# Patient Record
Sex: Male | Born: 1985
Health system: Southern US, Community
[De-identification: ages and names within clinical notes are randomized; demographics above are authoritative.]

## PROBLEM LIST (undated history)

## (undated) DIAGNOSIS — F909 Attention-deficit hyperactivity disorder, unspecified type: Secondary | ICD-10-CM

## (undated) DIAGNOSIS — D649 Anemia, unspecified: Secondary | ICD-10-CM

## (undated) DIAGNOSIS — I639 Cerebral infarction, unspecified: Secondary | ICD-10-CM

## (undated) DIAGNOSIS — R202 Paresthesia of skin: Secondary | ICD-10-CM

## (undated) DIAGNOSIS — F329 Major depressive disorder, single episode, unspecified: Secondary | ICD-10-CM

## (undated) DIAGNOSIS — F32A Depression, unspecified: Secondary | ICD-10-CM

## (undated) DIAGNOSIS — G459 Transient cerebral ischemic attack, unspecified: Secondary | ICD-10-CM

## (undated) DIAGNOSIS — I10 Essential (primary) hypertension: Secondary | ICD-10-CM

## (undated) DIAGNOSIS — F419 Anxiety disorder, unspecified: Secondary | ICD-10-CM

## (undated) HISTORY — DX: Attention-deficit hyperactivity disorder, unspecified type: F90.9

## (undated) HISTORY — DX: Paresthesia of skin: R20.2

## (undated) HISTORY — DX: Major depressive disorder, single episode, unspecified: F32.9

## (undated) HISTORY — DX: Anxiety disorder, unspecified: F41.9

## (undated) HISTORY — PX: HERNIA REPAIR: SHX51

## (undated) HISTORY — DX: Anemia, unspecified: D64.9

## (undated) HISTORY — DX: Cerebral infarction, unspecified: I63.9

## (undated) HISTORY — DX: Depression, unspecified: F32.A

## (undated) HISTORY — DX: Essential (primary) hypertension: I10

---
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
## 2006-02-17 ENCOUNTER — Emergency Department (HOSPITAL_COMMUNITY): Admission: EM | Admit: 2006-02-17 | Discharge: 2006-02-18 | Payer: Self-pay | Admitting: Emergency Medicine

## 2006-11-15 ENCOUNTER — Emergency Department (HOSPITAL_COMMUNITY): Admission: EM | Admit: 2006-11-15 | Discharge: 2006-11-15 | Payer: Self-pay | Admitting: Emergency Medicine

## 2007-08-11 ENCOUNTER — Emergency Department (HOSPITAL_COMMUNITY): Admission: EM | Admit: 2007-08-11 | Discharge: 2007-08-11 | Payer: Self-pay | Admitting: Emergency Medicine

## 2010-06-25 ENCOUNTER — Emergency Department (HOSPITAL_COMMUNITY)
Admission: EM | Admit: 2010-06-25 | Discharge: 2010-06-25 | Payer: Self-pay | Source: Home / Self Care | Admitting: Emergency Medicine

## 2010-08-07 ENCOUNTER — Emergency Department (HOSPITAL_COMMUNITY)
Admission: EM | Admit: 2010-08-07 | Discharge: 2010-08-08 | Disposition: A | Discharge status: Home / Self Care | Payer: Self-pay | Attending: Emergency Medicine | Admitting: Emergency Medicine

## 2010-08-07 DIAGNOSIS — F17200 Nicotine dependence, unspecified, uncomplicated: Secondary | ICD-10-CM | POA: Insufficient documentation

## 2010-08-07 DIAGNOSIS — IMO0002 Reserved for concepts with insufficient information to code with codable children: Secondary | ICD-10-CM | POA: Insufficient documentation

## 2010-08-07 DIAGNOSIS — Y929 Unspecified place or not applicable: Secondary | ICD-10-CM | POA: Insufficient documentation

## 2010-08-07 DIAGNOSIS — H9209 Otalgia, unspecified ear: Secondary | ICD-10-CM | POA: Insufficient documentation

## 2010-08-07 DIAGNOSIS — T169XXA Foreign body in ear, unspecified ear, initial encounter: Secondary | ICD-10-CM | POA: Insufficient documentation

## 2011-02-22 LAB — URINALYSIS, ROUTINE W REFLEX MICROSCOPIC
Bilirubin Urine: NEGATIVE
Glucose, UA: NEGATIVE
Ketones, ur: NEGATIVE
pH: 6

## 2011-03-17 LAB — URINALYSIS, ROUTINE W REFLEX MICROSCOPIC
Hgb urine dipstick: NEGATIVE
Nitrite: NEGATIVE
Specific Gravity, Urine: 1.02
pH: 7.5

## 2011-03-17 LAB — URINE MICROSCOPIC-ADD ON

## 2011-03-17 LAB — URINE CULTURE: Colony Count: NO GROWTH

## 2011-06-01 DIAGNOSIS — I639 Cerebral infarction, unspecified: Secondary | ICD-10-CM

## 2011-06-01 HISTORY — DX: Cerebral infarction, unspecified: I63.9

## 2014-09-11 ENCOUNTER — Encounter (HOSPITAL_COMMUNITY): Payer: Self-pay | Admitting: *Deleted

## 2014-09-11 ENCOUNTER — Emergency Department (HOSPITAL_COMMUNITY): Payer: Self-pay

## 2014-09-11 ENCOUNTER — Emergency Department (HOSPITAL_COMMUNITY)
Admission: EM | Admit: 2014-09-11 | Discharge: 2014-09-11 | Disposition: A | Discharge status: Home / Self Care | Payer: Self-pay | Attending: Emergency Medicine | Admitting: Emergency Medicine

## 2014-09-11 DIAGNOSIS — Z720 Tobacco use: Secondary | ICD-10-CM | POA: Insufficient documentation

## 2014-09-11 DIAGNOSIS — Z8673 Personal history of transient ischemic attack (TIA), and cerebral infarction without residual deficits: Secondary | ICD-10-CM | POA: Insufficient documentation

## 2014-09-11 DIAGNOSIS — F1210 Cannabis abuse, uncomplicated: Secondary | ICD-10-CM | POA: Insufficient documentation

## 2014-09-11 DIAGNOSIS — R51 Headache: Secondary | ICD-10-CM | POA: Insufficient documentation

## 2014-09-11 DIAGNOSIS — R202 Paresthesia of skin: Secondary | ICD-10-CM | POA: Insufficient documentation

## 2014-09-11 HISTORY — DX: Transient cerebral ischemic attack, unspecified: G45.9

## 2014-09-11 LAB — CBC WITH DIFFERENTIAL/PLATELET
BASOS ABS: 0 10*3/uL (ref 0.0–0.1)
BASOS PCT: 0 % (ref 0–1)
EOS PCT: 1 % (ref 0–5)
Eosinophils Absolute: 0 10*3/uL (ref 0.0–0.7)
HEMATOCRIT: 41.7 % (ref 39.0–52.0)
Hemoglobin: 13.7 g/dL (ref 13.0–17.0)
LYMPHS ABS: 2.3 10*3/uL (ref 0.7–4.0)
LYMPHS PCT: 32 % (ref 12–46)
MCH: 29.6 pg (ref 26.0–34.0)
MCHC: 32.9 g/dL (ref 30.0–36.0)
MCV: 90.1 fL (ref 78.0–100.0)
MONO ABS: 0.7 10*3/uL (ref 0.1–1.0)
MONOS PCT: 9 % (ref 3–12)
Neutro Abs: 4.1 10*3/uL (ref 1.7–7.7)
Neutrophils Relative %: 58 % (ref 43–77)
Platelets: 223 10*3/uL (ref 150–400)
RBC: 4.63 MIL/uL (ref 4.22–5.81)
RDW: 12.7 % (ref 11.5–15.5)
WBC: 7.1 10*3/uL (ref 4.0–10.5)

## 2014-09-11 LAB — BASIC METABOLIC PANEL
ANION GAP: 9 (ref 5–15)
BUN: 11 mg/dL (ref 6–23)
CALCIUM: 9 mg/dL (ref 8.4–10.5)
CO2: 24 mmol/L (ref 19–32)
Chloride: 104 mmol/L (ref 96–112)
Creatinine, Ser: 0.84 mg/dL (ref 0.50–1.35)
Glucose, Bld: 102 mg/dL — ABNORMAL HIGH (ref 70–99)
Potassium: 3.7 mmol/L (ref 3.5–5.1)
SODIUM: 137 mmol/L (ref 135–145)

## 2014-09-11 LAB — RAPID URINE DRUG SCREEN, HOSP PERFORMED
AMPHETAMINES: NOT DETECTED
BENZODIAZEPINES: NOT DETECTED
Barbiturates: NOT DETECTED
Cocaine: NOT DETECTED
Opiates: NOT DETECTED
TETRAHYDROCANNABINOL: POSITIVE — AB

## 2014-09-11 LAB — TSH: TSH: 3.516 u[IU]/mL (ref 0.350–4.500)

## 2014-09-11 MED ORDER — ASPIRIN 325 MG PO TABS
325.0000 mg | ORAL_TABLET | Freq: Once | ORAL | Status: AC
  Administered 2014-09-11: 325 mg via ORAL
  Filled 2014-09-11: qty 1

## 2014-09-11 NOTE — ED Provider Notes (Signed)
CSN: [id number]     Arrival date & time 09/11/14  1900 History   First MD Initiated Contact with Patient 09/11/14 1949     Chief Complaint  Patient presents with  . Numbness     (Consider location/radiation/quality/duration/timing/severity/associated sxs/prior Treatment) HPI Complains of intermittent numbness of tongue and around lips and in her left hand onset 4:30 PM today. Patient has had several episodes lasting from 3-10 minutes. No visual changes no difficulty speaking accompanied symptoms include mild frontal headache. No treatment prior to coming here. No difficulty with gait. No other associated symptoms. He is presently asymptomatic without treatment. Past Medical History  Diagnosis Date  . TIA (transient ischemic attack)    Past Surgical History  Procedure Laterality Date  . Hernia repair     History reviewed. No pertinent family history. History  Substance Use Topics  . Smoking status: Current Every Day Smoker  . Smokeless tobacco: Not on file  . Alcohol Use: No    Review of Systems  Neurological: Positive for numbness and headaches.  All other systems reviewed and are negative.     Allergies  Review of patient's allergies indicates no known allergies.  Home Medications   Prior to Admission medications   Medication Sig Start Date End Date Taking? Authorizing Provider  Acetaminophen (PAIN RELIEVER NO ASA EX ST PO) Take 1-2 tablets by mouth once as needed (FOR PAIN).   Yes Historical Provider, MD   BP 136/86 mmHg  Pulse 68  Temp(Src) 97.9 F (36.6 C) (Oral)  Resp 20  Ht  (1.803 m)  Wt 190 lb (86.183 kg)  BMI 26.51 kg/m2  SpO2 100% Physical Exam  Constitutional: He is oriented to person, place, and time. He appears well-developed and well-nourished.  HENT:  Head: Normocephalic and atraumatic.  Eyes: Conjunctivae are normal. Pupils are equal, round, and reactive to light.  Neck: Neck supple. No tracheal deviation present. No thyromegaly present.   Cardiovascular: Normal rate and regular rhythm.   No murmur heard. Pulmonary/Chest: Effort normal and breath sounds normal.  Abdominal: Soft. Bowel sounds are normal. He exhibits no distension. There is no tenderness.  Musculoskeletal: Normal range of motion. He exhibits no edema or tenderness.  Neurological: He is alert and oriented to person, place, and time. He has normal reflexes. Coordination normal.  DTRs symmetric bilaterally at knee jerk ankle jerk and biceps historical and bilaterally gait normal Romberg normal pronator drift normal  Skin: Skin is warm and dry. No rash noted.  Psychiatric: He has a normal mood and affect.  Nursing note and vitals reviewed.   ED Course  Procedures (including critical care time) Labs Review Labs Reviewed - No data to display  Imaging Review No results found.   EKG Interpretation   Date/Time:  Wednesday September 11 2014 21:48:20 EDT Ventricular Rate:  74 PR Interval:  165 QRS Duration: 97 QT Interval:  395 QTC Calculation: 438 R Axis:   79 Text Interpretation:  Sinus rhythm No old tracing to compare Confirmed by  Ethelda Chick  MD, Delvecchio Madole [id number]) on 09/11/2014 10:09:09 PM     Results for orders placed or performed during the hospital encounter of 09/11/14  Urine Drug Screen  Result Value Ref Range   Opiates NONE DETECTED NONE DETECTED   Cocaine NONE DETECTED NONE DETECTED   Benzodiazepines NONE DETECTED NONE DETECTED   Amphetamines NONE DETECTED NONE DETECTED   Tetrahydrocannabinol POSITIVE (A) NONE DETECTED   Barbiturates NONE DETECTED NONE DETECTED  CBC with Differential/Platelet  Result Value  Ref Range   WBC 7.1 4.0 - 10.5 K/uL   RBC 4.63 4.22 - 5.81 MIL/uL   Hemoglobin 13.7 13.0 - 17.0 g/dL   HCT 16.141.7 09.639.0 - 04.552.0 %   MCV 90.1 78.0 - 100.0 fL   MCH 29.6 26.0 - 34.0 pg   MCHC 32.9 30.0 - 36.0 g/dL   RDW 40.912.7 81.111.5 - 91.415.5 %   Platelets 223 150 - 400 K/uL   Neutrophils Relative % 58 43 - 77 %   Neutro Abs 4.1 1.7 - 7.7 K/uL    Lymphocytes Relative 32 12 - 46 %   Lymphs Abs 2.3 0.7 - 4.0 K/uL   Monocytes Relative 9 3 - 12 %   Monocytes Absolute 0.7 0.1 - 1.0 K/uL   Eosinophils Relative 1 0 - 5 %   Eosinophils Absolute 0.0 0.0 - 0.7 K/uL   Basophils Relative 0 0 - 1 %   Basophils Absolute 0.0 0.0 - 0.1 K/uL  Basic metabolic panel  Result Value Ref Range   Sodium 137 135 - 145 mmol/L   Potassium 3.7 3.5 - 5.1 mmol/L   Chloride 104 96 - 112 mmol/L   CO2 24 19 - 32 mmol/L   Glucose, Bld 102 (H) 70 - 99 mg/dL   BUN 11 6 - 23 mg/dL   Creatinine, Ser 7.820.84 0.50 - 1.35 mg/dL   Calcium 9.0 8.4 - 95.610.5 mg/dL   GFR calc non Af Amer >90 >90 mL/min   GFR calc Af Amer >90 >90 mL/min   Anion gap 9 5 - 15   Ct Head Wo Contrast  09/11/2014   CLINICAL DATA:  Left arm and tongue numbness  EXAM: CT HEAD WITHOUT CONTRAST  TECHNIQUE: Contiguous axial images were obtained from the base of the skull through the vertex without intravenous contrast.  COMPARISON:  Brain MRI September 11, 2014  FINDINGS: The ventricles are normal in size and configuration. There is no mass, hemorrhage, extra-axial fluid collection, or midline shift. Gray-white compartments are normal. No acute infarct apparent. Bony calvarium appears intact. The mastoid air cells are clear.  IMPRESSION: Study within normal limits.   Electronically Signed   By: Bretta BangWilliam  Woodruff III M.D.   On: 09/11/2014 22:00    MDM  [facility] with neurolog[facility] evaluation with echocardiogram carotid Dopplers. He feels that can be done as an inpatient or outpatient . Neurology also suggests aspirin. After lengthy discussion with patient he says he has a primary care physician who can arrange for outpatient studies Consultation for 5 minutes on smoking cessation Diagnosis#1 paresthesias Differential diagnosis includes TIA, migraine, atypical seizure #2 substance abuse #3 tobacco abuse Final diagnoses:  None        Doug SouSam Iker Nuttall,  MD 09/11/14 2243

## 2014-09-11 NOTE — Discharge Instructions (Signed)
Take aspirin 325 mg daily. Contact your primary care physician tomorrow. You need further outpatient testing which he can arrange for you. The tests needed are echocardiogram and carotid Doppler studies. Ask your primary care physician to help you to stop smoking as smoking is a risk factor for stroke and TIA. Return if you feel worse for any reason.

## 2014-09-11 NOTE — ED Notes (Signed)
Notified SOC @ 2025

## 2014-09-11 NOTE — ED Notes (Signed)
MD at bedside. 

## 2014-09-11 NOTE — ED Notes (Signed)
Patient transported to MRI 

## 2014-09-11 NOTE — ED Notes (Signed)
TTS at bedside completing assessment.

## 2014-09-11 NOTE — ED Notes (Signed)
Numb sensatin lt arm and tongue  Took an energy pill today.  Alert.  Hx of tia

## 2014-09-11 NOTE — ED Notes (Signed)
EDP at bedside  

## 2014-09-11 NOTE — ED Notes (Signed)
Onset 1930  Numbness/tingling in left hand   And around mouth

## 2014-09-12 LAB — VITAMIN B12: VITAMIN B 12: 314 pg/mL (ref 211–911)

## 2014-09-13 LAB — RPR: RPR Ser Ql: NONREACTIVE

## 2014-09-13 LAB — FOLATE RBC
Folate, Hemolysate: 450.2 ng/mL
Folate, RBC: 1085 ng/mL (ref >498)
Hematocrit: 41.5 % (ref 37.5–51.0)

## 2014-09-13 LAB — HIV ANTIBODY (ROUTINE TESTING W REFLEX): HIV SCREEN 4TH GENERATION: NONREACTIVE

## 2015-06-24 ENCOUNTER — Encounter: Payer: Self-pay | Admitting: Family Medicine

## 2015-06-24 ENCOUNTER — Ambulatory Visit (INDEPENDENT_AMBULATORY_CARE_PROVIDER_SITE_OTHER): Payer: BLUE CROSS/BLUE SHIELD | Admitting: Family Medicine

## 2015-06-24 VITALS — BP 140/78 | HR 80 | Temp 98.0°F | Resp 16 | Ht 69.5 in | Wt 205.0 lb

## 2015-06-24 DIAGNOSIS — K047 Periapical abscess without sinus: Secondary | ICD-10-CM

## 2015-06-24 DIAGNOSIS — F319 Bipolar disorder, unspecified: Secondary | ICD-10-CM

## 2015-06-24 DIAGNOSIS — Z8673 Personal history of transient ischemic attack (TIA), and cerebral infarction without residual deficits: Secondary | ICD-10-CM

## 2015-06-24 DIAGNOSIS — Z720 Tobacco use: Secondary | ICD-10-CM

## 2015-06-24 DIAGNOSIS — L723 Sebaceous cyst: Secondary | ICD-10-CM

## 2015-06-24 MED ORDER — AMOXICILLIN 500 MG PO CAPS: 500.0000 mg | ORAL_CAPSULE | Freq: Three times a day (TID) | ORAL | Status: DC

## 2015-06-24 MED ORDER — ACETAMINOPHEN-CODEINE #3 300-30 MG PO TABS: 1.0000 | ORAL_TABLET | Freq: Four times a day (QID) | ORAL | Status: DC | PRN
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
## 2015-06-24 NOTE — Patient Instructions (Addendum)
Release of records- [facility] in [place] We will call with lab results Echo and Ultrasound to be done We will check with Dr. Kristin Bruins for dental surgery  F/U pending results

## 2015-06-25 ENCOUNTER — Encounter: Payer: Self-pay | Admitting: Family Medicine

## 2015-06-25 LAB — LIPID PANEL
CHOL/HDL RATIO: 4.8 ratio (ref <=5.0)
Cholesterol: 158 mg/dL (ref 125–200)
HDL: 33 mg/dL — ABNORMAL LOW (ref >=40)
LDL CALC: 82 mg/dL (ref <130)
Triglycerides: 217 mg/dL — ABNORMAL HIGH (ref <150)
VLDL: 43 mg/dL — ABNORMAL HIGH (ref <30)

## 2015-06-25 LAB — CBC WITH DIFFERENTIAL/PLATELET
BASOS ABS: 0 10*3/uL (ref 0.0–0.1)
BASOS PCT: 0 % (ref 0–1)
EOS ABS: 0.1 10*3/uL (ref 0.0–0.7)
Eosinophils Relative: 1 % (ref 0–5)
HCT: 44 % (ref 39.0–52.0)
Hemoglobin: 14.6 g/dL (ref 13.0–17.0)
Lymphocytes Relative: 42 % (ref 12–46)
Lymphs Abs: 3 10*3/uL (ref 0.7–4.0)
MCH: 28.9 pg (ref 26.0–34.0)
MCHC: 33.2 g/dL (ref 30.0–36.0)
MCV: 87 fL (ref 78.0–100.0)
MPV: 10.9 fL (ref 8.6–12.4)
Monocytes Absolute: 0.6 10*3/uL (ref 0.1–1.0)
Monocytes Relative: 8 % (ref 3–12)
NEUTROS PCT: 49 % (ref 43–77)
Neutro Abs: 3.5 10*3/uL (ref 1.7–7.7)
PLATELETS: 301 10*3/uL (ref 150–400)
RBC: 5.06 MIL/uL (ref 4.22–5.81)
RDW: 13.2 % (ref 11.5–15.5)
WBC: 7.2 10*3/uL (ref 4.0–10.5)

## 2015-06-25 LAB — COMPREHENSIVE METABOLIC PANEL
ALK PHOS: 84 U/L (ref 40–115)
ALT: 23 U/L (ref 9–46)
AST: 16 U/L (ref 10–40)
Albumin: 4.3 g/dL (ref 3.6–5.1)
BUN: 9 mg/dL (ref 7–25)
CHLORIDE: 104 mmol/L (ref 98–110)
CO2: 27 mmol/L (ref 20–31)
CREATININE: 0.75 mg/dL (ref 0.60–1.35)
Calcium: 9.5 mg/dL (ref 8.6–10.3)
GLUCOSE: 82 mg/dL (ref 70–99)
Potassium: 4.3 mmol/L (ref 3.5–5.3)
SODIUM: 140 mmol/L (ref 135–146)
TOTAL PROTEIN: 6.8 g/dL (ref 6.1–8.1)
Total Bilirubin: 0.4 mg/dL (ref 0.2–1.2)

## 2015-06-25 LAB — TSH: TSH: 1.844 u[IU]/mL (ref 0.350–4.500)

## 2015-06-26 ENCOUNTER — Encounter: Payer: Self-pay | Admitting: *Deleted

## 2015-06-27 ENCOUNTER — Encounter: Payer: Self-pay | Admitting: Family Medicine

## 2015-06-27 DIAGNOSIS — I639 Cerebral infarction, unspecified: Secondary | ICD-10-CM | POA: Insufficient documentation

## 2015-06-27 DIAGNOSIS — G459 Transient cerebral ischemic attack, unspecified: Secondary | ICD-10-CM | POA: Insufficient documentation

## 2015-06-27 DIAGNOSIS — F909 Attention-deficit hyperactivity disorder, unspecified type: Secondary | ICD-10-CM | POA: Insufficient documentation

## 2015-06-27 DIAGNOSIS — F419 Anxiety disorder, unspecified: Secondary | ICD-10-CM | POA: Insufficient documentation

## 2015-06-27 DIAGNOSIS — I10 Essential (primary) hypertension: Secondary | ICD-10-CM | POA: Insufficient documentation

## 2015-06-30 ENCOUNTER — Ambulatory Visit (HOSPITAL_COMMUNITY): Payer: BLUE CROSS/BLUE SHIELD

## 2015-07-01 ENCOUNTER — Ambulatory Visit: Payer: Self-pay | Admitting: Family Medicine

## 2015-07-01 ENCOUNTER — Ambulatory Visit (HOSPITAL_COMMUNITY): Payer: BLUE CROSS/BLUE SHIELD | Attending: Family Medicine

## 2015-07-23 ENCOUNTER — Telehealth: Payer: Self-pay | Admitting: Family Medicine

## 2015-07-23 NOTE — Telephone Encounter (Signed)
-----   Message from Samuella Cota, [place] sent at 07/09/2015  8:57 AM EST ----- Regarding: RE: F/U Testing Dr. Kristin Bruins office called back spoke to [facility] and she stated that Dr. Kristin Bruins is not an oral surgeon so can not help this pt. I can call around and see who will be able to help this pt.   As far as the ECHO and Carotids looks like the pt was a NO SHOW, I spoke to wife when I scheduled and she was aware of the appt. I do not know reason why they cancelled it. ----- Message -----    From: Salley Scarlet, MD    Sent: 07/08/2015   1:22 PM      To: Samuella Cota, CMA Subject: F/U Testing                                      Pt was to have Echo and carotids done, I see they were cancelled  Also did he get any information about Dentist with [facility] Dr. Kristin Bruins, he needs to have teeth removed, see if they would take his insurance

## 2015-07-25 ENCOUNTER — Telehealth: Payer: Self-pay | Admitting: *Deleted

## 2015-07-25 NOTE — Telephone Encounter (Signed)
-----   Message from Salley Scarlet, MD sent at 07/20/2015  8:17 PM EST ----- Regarding: FW: F/U Testing Please call pt and ask to reschedule, document in chart  ----- Message -----    From: Samuella Cota, CMA    Sent: 07/09/2015   8:57 AM      To: Salley Scarlet, MD Subject: RE: F/U Testing                                Dr. Kristin Bruins office called back spoke to [facility] and she stated that Dr. Kristin Bruins is not an oral surgeon so can not help this pt. I can call around and see who will be able to help this pt.   As far as the ECHO and Carotids looks like the pt was a NO SHOW, I spoke to wife when I scheduled and she was aware of the appt. I do not know reason why they cancelled it. ----- Message -----    From: Salley Scarlet, MD    Sent: 07/08/2015   1:22 PM      To: Samuella Cota, CMA Subject: F/U Testing                                      Pt was to have Echo and carotids done, I see they were cancelled  Also did he get any information about Dentist with [facility] Dr. Kristin Bruins, he needs to have teeth removed, see if they would take his insurance

## 2015-07-25 NOTE — Telephone Encounter (Signed)
LMTRC on mobile number

## 2015-07-31 NOTE — Telephone Encounter (Signed)
Pt wife called back and I gave her some information on oral surgeons and informed her that most oral surgeons are recommending the Care Credit, which helps pay for out-of-pocket healthcare expenses. Once approved, can use it again and again* to help manage health, wellness, and personal care for cost that is not covered by insurance. Wife states that she will try to go on line and apply.  Also informed pt that most dental surgeons have a consultation fee of 95-150 dollars which includes xrays, and extraction fees are 179-625 depending on difficulty. Pt states will discuss with husband and will get back with me.   Pt wife also stated that she will call and reschedule for the ECHO and Carotids and reason did not keep appt was waiting on new insurance to kick in which is now effective Mar 1, 17

## 2015-08-01 NOTE — Telephone Encounter (Signed)
noted 

## 2015-08-11 ENCOUNTER — Other Ambulatory Visit: Payer: Self-pay | Admitting: Family Medicine

## 2015-08-11 DIAGNOSIS — Z8673 Personal history of transient ischemic attack (TIA), and cerebral infarction without residual deficits: Secondary | ICD-10-CM

## 2015-08-11 DIAGNOSIS — R2243 Localized swelling, mass and lump, lower limb, bilateral: Secondary | ICD-10-CM

## 2015-08-15 ENCOUNTER — Other Ambulatory Visit (HOSPITAL_COMMUNITY): Payer: BLUE CROSS/BLUE SHIELD

## 2015-08-15 ENCOUNTER — Ambulatory Visit (HOSPITAL_COMMUNITY): Payer: BLUE CROSS/BLUE SHIELD

## 2015-08-22 ENCOUNTER — Ambulatory Visit (HOSPITAL_COMMUNITY): Payer: BLUE CROSS/BLUE SHIELD | Attending: Family Medicine

## 2015-08-22 ENCOUNTER — Ambulatory Visit (HOSPITAL_COMMUNITY): Admission: RE | Admit: 2015-08-22 | Payer: BLUE CROSS/BLUE SHIELD | Source: Ambulatory Visit

## 2015-09-02 ENCOUNTER — Other Ambulatory Visit: Payer: Self-pay | Admitting: Family Medicine

## 2015-09-02 DIAGNOSIS — R2243 Localized swelling, mass and lump, lower limb, bilateral: Secondary | ICD-10-CM

## 2015-09-02 DIAGNOSIS — Z8673 Personal history of transient ischemic attack (TIA), and cerebral infarction without residual deficits: Secondary | ICD-10-CM

## 2015-09-05 ENCOUNTER — Other Ambulatory Visit: Payer: BLUE CROSS/BLUE SHIELD

## 2015-09-08 ENCOUNTER — Other Ambulatory Visit: Payer: BLUE CROSS/BLUE SHIELD

## 2015-09-15 ENCOUNTER — Inpatient Hospital Stay: Admission: RE | Admit: 2015-09-15 | Payer: BLUE CROSS/BLUE SHIELD | Source: Ambulatory Visit

## 2015-09-22 ENCOUNTER — Other Ambulatory Visit: Payer: Self-pay | Admitting: Family Medicine

## 2015-09-24 ENCOUNTER — Other Ambulatory Visit: Payer: Self-pay | Admitting: Family Medicine

## 2015-09-26 ENCOUNTER — Other Ambulatory Visit: Payer: BLUE CROSS/BLUE SHIELD

## 2015-09-26 ENCOUNTER — Ambulatory Visit: Payer: BLUE CROSS/BLUE SHIELD | Admitting: Family Medicine

## 2015-09-29 ENCOUNTER — Encounter: Payer: Self-pay | Admitting: Family Medicine

## 2015-09-29 ENCOUNTER — Ambulatory Visit (INDEPENDENT_AMBULATORY_CARE_PROVIDER_SITE_OTHER): Payer: BLUE CROSS/BLUE SHIELD | Admitting: Family Medicine

## 2015-09-29 VITALS — BP 128/70 | HR 82 | Temp 98.0°F | Resp 16 | Ht 69.5 in | Wt 202.0 lb

## 2015-09-29 DIAGNOSIS — K047 Periapical abscess without sinus: Secondary | ICD-10-CM

## 2015-09-29 MED ORDER — AMOXICILLIN 500 MG PO CAPS: 500.0000 mg | ORAL_CAPSULE | Freq: Three times a day (TID) | ORAL | Status: DC

## 2015-09-29 MED ORDER — ACETAMINOPHEN-CODEINE #3 300-30 MG PO TABS: 1.0000 | ORAL_TABLET | Freq: Four times a day (QID) | ORAL | Status: DC | PRN

## 2015-09-29 NOTE — Patient Instructions (Signed)
F/U Pending results  Take antibiotics

## 2015-09-29 NOTE — Progress Notes (Signed)
Patient ID: Danny Mathis, male   DOB: 03/23/1986, 30 y.o.   MRN: [id number]     Subjective:    Patient ID: Danny SalvageJames L Mathis, male    DOB: 09/18/1985, 30 y.o.   MRN: [id number]  Patient presents for Tooth Pain Patient here with persistent tooth pain. He is still not seen a dentist. We tried calling around oral surgeons on his behalf with his insurance he was then given informationHe states because of his insurance change they will not do anything surgical with his teeth for another year. He gets basic dental insurance after the first 6 months. When he did call around he was told that he would have to pay per tooth and he does not have the finances to do this. I gave him amoxicillin for 10 days and Tylenol 3 back in January.  With regards to his mini stroke like episodes and chest discomfort he has not had any since he quit drinking. 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 I had him set up to have carotid ultrasounds on an echocardiogram because of the multiple episodes I also have hospital visits where they were concerned and also requested this workup to be done. Due to finances he had to put this off. He is now scheduled to have the ultrasound done on his carotids next week and he is set to see a cardiologist on May 19   Review Of Systems:  GEN- denies fatigue, fever, weight loss,weakness, recent illness HEENT- denies eye drainage, change in vision, nasal discharge, CVS- denies chest pain, palpitations RESP- denies SOB, cough, wheeze ABD- denies N/V, change in stools, abd pain GU- denies dysuria, hematuria, dribbling, incontinence MSK- denies joint pain, muscle aches, injury Neuro- denies headache, dizziness, syncope, seizure activity       Objective:    BP 128/70 mmHg  Pulse 82  Temp(Src) 98 F (36.7 C) (Oral)  Resp 16  Ht 5' 9.5" (1.765 m)  Wt 202 lb (91.627 kg)  BMI 29.41 kg/m2 GEN- NAD, alert and  oriented x3 HEENT- PERRL, EOMI, non injected sclera, pink conjunctiva, MMM, oropharynx poor dentitiom, swelling of gums, broken teeth,TTP entire gumline upper and lower  Neck- Supple, shotty ant LAD  CVS- RRR, no murmur RESP-CTAB Pulses- Radial 2+        Assessment & Plan:      Problem List Items Addressed This Visit    None    Visit Diagnoses    Dental abscess    -  Primary    Recurrent dental abscess, he is using peroxide rinse, give another round of amox and tylenol #3, discussed I can not continue to treat his dental issues. Also discussed that he really needs to follow through with his cardiac and ultrasound workup he is actually been to a few different hospitals with these TIA-like episodes witnessed. He is very young but I think that he needs imaging. If we do find that there is some type of valve problem on his echocardiogram this may help us expedite his dental issues using his medical insurance.        Note: This dictation was prepared with Dragon dictation along with smaller phrase technology. Any transcriptional errors that result from this process are unintentional.

## 2015-10-01 ENCOUNTER — Ambulatory Visit: Payer: BLUE CROSS/BLUE SHIELD | Admitting: Cardiology

## 2015-10-06 ENCOUNTER — Ambulatory Visit
Admission: RE | Admit: 2015-10-06 | Discharge: 2015-10-06 | Disposition: A | Discharge status: Home / Self Care | Payer: BLUE CROSS/BLUE SHIELD | Source: Ambulatory Visit | Attending: Family Medicine | Admitting: Family Medicine

## 2015-10-06 DIAGNOSIS — R2243 Localized swelling, mass and lump, lower limb, bilateral: Secondary | ICD-10-CM

## 2015-10-06 DIAGNOSIS — Z8673 Personal history of transient ischemic attack (TIA), and cerebral infarction without residual deficits: Secondary | ICD-10-CM

## 2015-10-17 ENCOUNTER — Ambulatory Visit: Payer: BLUE CROSS/BLUE SHIELD | Admitting: Cardiovascular Disease

## 2015-10-21 ENCOUNTER — Encounter: Payer: Self-pay | Admitting: *Deleted

## 2015-12-08 ENCOUNTER — Telehealth: Payer: Self-pay | Admitting: Cardiology

## 2015-12-08 NOTE — Telephone Encounter (Signed)
**Note De-identified Danny Mathis Obfuscation** LMTCB

## 2015-12-08 NOTE — Telephone Encounter (Signed)
**Note De-Identified Gethsemane Fischler Obfuscation** The pt has not been seen in this office before. The pt c/o the same s/s that occur often. He reports that first his peripheral vision in his left eye becomes blurred then about 10 mins later his left arm is numb, he has a bad taste in his mouth and has a bad headache. He is advised that we have scheduled him in our first available slot which is this Friday 7/14 with Dr Mayford Knifeurner and that we have no sooner appt to offer him at this time. He is advised to call 911 or have someone drive him to the ER, have someone drive him to Urgent care or to contact his PCP for advisement. He states that he has contacted his PCP and is waiting for a call back from him.

## 2015-12-08 NOTE — Telephone Encounter (Signed)
New Message:   Pt needs to be seen asap. His left arm was numb,bad taste in his mouth like metal,vision is blurred in his left eye and has a headache.Pt is scheduled to see Dr Mayford Knifeurner on Friday,he says he need to be seen asap.

## 2015-12-12 ENCOUNTER — Ambulatory Visit (INDEPENDENT_AMBULATORY_CARE_PROVIDER_SITE_OTHER): Payer: BLUE CROSS/BLUE SHIELD | Admitting: Cardiology

## 2015-12-12 ENCOUNTER — Encounter: Payer: Self-pay | Admitting: Cardiology

## 2015-12-12 VITALS — BP 124/87 | HR 70 | Ht 69.5 in | Wt 195.8 lb

## 2015-12-12 DIAGNOSIS — R079 Chest pain, unspecified: Secondary | ICD-10-CM | POA: Diagnosis not present

## 2015-12-12 DIAGNOSIS — I10 Essential (primary) hypertension: Secondary | ICD-10-CM | POA: Diagnosis not present

## 2015-12-12 DIAGNOSIS — G458 Other transient cerebral ischemic attacks and related syndromes: Secondary | ICD-10-CM

## 2015-12-12 NOTE — Progress Notes (Signed)
Cardiology Office Note    Date:  12/12/2015   ID:  Danny Mathis, DOB Apr 17, 1986, MRN [id number]  PCP:  Danny Antis, MD  Cardiologist:  Danny Magic, MD   Chief Complaint  Patient presents with  . New Evaluation    TIA, HTN, CP    History of Present Illness:  Danny Mathis is a 30 y.o. male with a history of TIA, HTN and depression who presents today for evaluation of left arm numbness and occasionally right arm numbness.  He had a full Neurologic assessment that was normal.  He said that it was quite frequent and quit drinking alcohol and now it has stopped except for 1 episode a week ago.  He had a sharp pain a week ago that was one sharp pain that radiated into his back and resolved.  He occasionally has some dull pain in his left chest that goes down his left arm and only lasts a second.  He denies any SOB, DOE, diaphoresis or nausea.  He has recently been diagnosed with a tooth infection and was treated with antibx but needs his teeth pulled.  He has been having night sweats for over 3 years on occasion.  He occasionally will have a chill but no fever.  He denies any palptiations, dizziness or syncope.     Past Medical History  Diagnosis Date  . TIA (transient ischemic attack)   . ADHD (attention deficit hyperactivity disorder)   . Depression   . MDD (major depressive disorder) (HCC)   . Stroke [facility]) 2013  . Hypertension   . Anxiety     Past Surgical History  Procedure Laterality Date  . Hernia repair      Current Medications: Outpatient Prescriptions Prior to Visit  Medication Sig Dispense Refill  . acetaminophen-codeine (TYLENOL #3) 300-30 MG tablet Take 1 tablet by mouth every 6 (six) hours as needed for moderate pain. 20 tablet 0  . amoxicillin (AMOXIL) 500 MG capsule Take 1 capsule (500 mg total) by mouth 3 (three) times daily. 30 capsule 0   No facility-administered medications prior to visit.     Allergies:   Review of patient's allergies indicates  no known allergies.   Social History   Social History  . Marital Status: Married    Spouse Name: N/A  . Number of Children: N/A  . Years of Education: N/A   Social History Main Topics  . Smoking status: Current Every Day Smoker -- 1.00 packs/day    Types: Cigarettes  . Smokeless tobacco: Never Used  . Alcohol Use: No  . Drug Use: Yes    Special: Marijuana  . Sexual Activity: Yes   Other Topics Concern  . None   Social History Narrative     Family History:  The patient's family history includes Alcohol abuse in his father; Arthritis in his father, mother, and sister; COPD in his mother; Cancer in his father; Depression in his mother; Diabetes in his maternal grandmother and paternal grandmother; Hearing loss in his father, mother, and sister; Heart disease in his father, paternal grandfather, and paternal grandmother; Hyperlipidemia in his father, mother, and sister; Hypertension in his father, maternal grandfather, maternal grandmother, mother, paternal grandfather, paternal grandmother, and sister; Miscarriages / Stillbirths in his paternal grandmother; Stroke in his paternal grandfather and paternal grandmother; Vision loss in his paternal grandfather and paternal grandmother.   ROS:   Please see the history of present illness.    ROS All other systems reviewed and are  negative.   PHYSICAL EXAM:   VS:  BP 124/87 mmHg  Pulse 70  Ht 5' 9.5" (1.765 m)  Wt 195 lb 12.8 oz (88.814 kg)  BMI 28.51 kg/m2   GEN: Well nourished, well developed, in no acute distress HEENT: normal Neck: no JVD, carotid bruits, or masses Cardiac: RRR; no murmurs, rubs, or gallops,no edema.  Intact distal pulses bilaterally.  Respiratory:  clear to auscultation bilaterally, normal work of breathing GI: soft, nontender, nondistended, + BS MS: no deformity or atrophy Skin: warm and dry, no rash Neuro:  Alert and Oriented x 3, Strength and sensation are intact Psych: euthymic mood, full affect  Wt  Readings from Last 3 Encounters:  12/12/15 195 lb 12.8 oz (88.814 kg)  09/29/15 202 lb (91.627 kg)  06/24/15 205 lb (92.987 kg)      Studies/Labs Reviewed:   EKG:  EKG is ordered today.  The ekg ordered today demonstrates NSR with no ST changes.   Recent Labs: 06/24/2015: ALT 23; BUN 9; Creat 0.75; Hemoglobin 14.6; Platelets 301; Potassium 4.3; Sodium 140; TSH 1.844   Lipid Panel    Component Value Date/Time   CHOL 158 06/24/2015 1536   TRIG 217* 06/24/2015 1536   HDL 33* 06/24/2015 1536   CHOLHDL 4.8 06/24/2015 1536   VLDL 43* 06/24/2015 1536   LDLCALC 82 06/24/2015 1536    Additional studies/ records that were reviewed today include:  None    ASSESSMENT:    1. Other specified transient cerebral ischemias   2. Essential hypertension   3. Chest pain, unspecified chest pain type      PLAN:  In order of problems listed above:  1. TIA - ? Etiology.  He has no palptiations.  He has had some neurological symptoms that are transient with left arm numbness. He has a history of migraine HA and he gets a HA every time his arm goes numb.  Neuro workup was normal.  His symptoms sound like migraine headaches.  I will get an echo to rule out valvular heart disease and assess for vegetation due to recent infection in his teeth and night sweats.   2. HTN - BP controlled on current meds.   3. Chest pain that is very atypical and EKG is nonischemic.  He does have a normal EKG.  He has a family history of CAD in mid 6950's.  I will get an ETT to rule out ischemia.     Medication Adjustments/Labs and Tests Ordered: Current medicines are reviewed at length with the patient today.  Concerns regarding medicines are outlined above.  Medication changes, Labs and Tests ordered today are listed in the Patient Instructions below.  There are no Patient Instructions on file for this visit.   Signed, Danny Magicraci Kadynce Bonds, MD  12/12/2015 3:28 PM    [facility] [address],  [place], [place]  [address] Phone: [phone]; Fax: [phone]

## 2015-12-12 NOTE — Patient Instructions (Signed)
Medication Instructions:  Your physician recommends that you continue on your current medications as directed. Please refer to the Current Medication list given to you today.   Labwork: None  Testing/Procedures: Your physician has requested that you have an echocardiogram. Echocardiography is a painless test that uses sound waves to create images of your heart. It provides your doctor with information about the size and shape of your heart and how well your heart's chambers and valves are working. This procedure takes approximately one hour. There are no restrictions for this procedure.   Your physician has requested that you have an exercise tolerance test. For further information please visit [email or web address]. Please also follow instruction sheet, as given.  Follow-Up: Your physician recommends that you schedule a follow-up appointment AS NEEDED with Dr. Turner pending study results.  Any Other Special Instructions Will Be Listed Below (If Applicable).     If you need a refill on your cardiac medications before your next appointment, please call your pharmacy.   

## 2016-01-02 ENCOUNTER — Other Ambulatory Visit: Payer: Self-pay

## 2016-01-02 ENCOUNTER — Ambulatory Visit (HOSPITAL_COMMUNITY): Payer: BLUE CROSS/BLUE SHIELD | Attending: Cardiology

## 2016-01-02 ENCOUNTER — Ambulatory Visit (INDEPENDENT_AMBULATORY_CARE_PROVIDER_SITE_OTHER): Payer: BLUE CROSS/BLUE SHIELD

## 2016-01-02 ENCOUNTER — Telehealth: Payer: Self-pay

## 2016-01-02 ENCOUNTER — Encounter (INDEPENDENT_AMBULATORY_CARE_PROVIDER_SITE_OTHER): Payer: Self-pay

## 2016-01-02 DIAGNOSIS — G458 Other transient cerebral ischemic attacks and related syndromes: Secondary | ICD-10-CM | POA: Diagnosis not present

## 2016-01-02 DIAGNOSIS — G459 Transient cerebral ischemic attack, unspecified: Secondary | ICD-10-CM

## 2016-01-02 DIAGNOSIS — I119 Hypertensive heart disease without heart failure: Secondary | ICD-10-CM | POA: Diagnosis not present

## 2016-01-02 DIAGNOSIS — I4891 Unspecified atrial fibrillation: Secondary | ICD-10-CM

## 2016-01-02 DIAGNOSIS — R079 Chest pain, unspecified: Secondary | ICD-10-CM

## 2016-01-02 LAB — EXERCISE TOLERANCE TEST
CHL CUP MPHR: 189 {beats}/min
CSEPEDS: 28 s
CSEPHR: 86 %
Estimated workload: 13.5 METS
Exercise duration (min): 12 min
Peak HR: 164 {beats}/min
RPE: 17
Rest HR: 46 {beats}/min

## 2016-01-02 NOTE — Telephone Encounter (Signed)
Informed patient of results and verbal understanding expressed.   Event monitor ordered for scheduling. Patient agrees with treatment plan. 

## 2016-01-02 NOTE — Telephone Encounter (Signed)
-----   Message from Quintella Reichert, MD sent at 01/02/2016  1:30 PM EDT ----- Please get a 30 day event monitor to assess for PAF with history of ? TIA

## 2016-01-13 ENCOUNTER — Other Ambulatory Visit: Payer: Self-pay

## 2016-01-13 DIAGNOSIS — I4891 Unspecified atrial fibrillation: Secondary | ICD-10-CM

## 2016-01-21 ENCOUNTER — Encounter (INDEPENDENT_AMBULATORY_CARE_PROVIDER_SITE_OTHER): Payer: Self-pay

## 2016-01-21 ENCOUNTER — Ambulatory Visit (INDEPENDENT_AMBULATORY_CARE_PROVIDER_SITE_OTHER): Payer: BLUE CROSS/BLUE SHIELD

## 2016-01-21 DIAGNOSIS — I4891 Unspecified atrial fibrillation: Secondary | ICD-10-CM | POA: Diagnosis not present

## 2016-03-15 ENCOUNTER — Encounter (HOSPITAL_COMMUNITY): Payer: Self-pay | Admitting: Emergency Medicine

## 2016-03-15 DIAGNOSIS — L509 Urticaria, unspecified: Secondary | ICD-10-CM | POA: Insufficient documentation

## 2016-03-15 DIAGNOSIS — F909 Attention-deficit hyperactivity disorder, unspecified type: Secondary | ICD-10-CM | POA: Insufficient documentation

## 2016-03-15 DIAGNOSIS — T7840XA Allergy, unspecified, initial encounter: Secondary | ICD-10-CM | POA: Diagnosis present

## 2016-03-15 DIAGNOSIS — Z8673 Personal history of transient ischemic attack (TIA), and cerebral infarction without residual deficits: Secondary | ICD-10-CM | POA: Insufficient documentation

## 2016-03-15 DIAGNOSIS — F17210 Nicotine dependence, cigarettes, uncomplicated: Secondary | ICD-10-CM | POA: Insufficient documentation

## 2016-03-15 DIAGNOSIS — I10 Essential (primary) hypertension: Secondary | ICD-10-CM

## 2016-03-15 DIAGNOSIS — Z5321 Procedure and treatment not carried out due to patient leaving prior to being seen by health care provider: Secondary | ICD-10-CM

## 2016-03-15 NOTE — ED Triage Notes (Signed)
Pt reports starting to break out and have generalized edema since this am. Pt denies changing any environmental elements. No new foods or meds. Pt denies difficulty breathing.

## 2016-03-16 ENCOUNTER — Emergency Department (HOSPITAL_COMMUNITY)
Admission: EM | Admit: 2016-03-16 | Discharge: 2016-03-16 | Disposition: A | Discharge status: Home / Self Care | Payer: BLUE CROSS/BLUE SHIELD | Source: Home / Self Care

## 2016-03-16 ENCOUNTER — Emergency Department (HOSPITAL_COMMUNITY)
Admission: EM | Admit: 2016-03-16 | Discharge: 2016-03-16 | Disposition: A | Discharge status: Home / Self Care | Payer: BLUE CROSS/BLUE SHIELD | Attending: Emergency Medicine | Admitting: Emergency Medicine

## 2016-03-16 ENCOUNTER — Encounter (HOSPITAL_COMMUNITY): Payer: Self-pay | Admitting: *Deleted

## 2016-03-16 DIAGNOSIS — L509 Urticaria, unspecified: Secondary | ICD-10-CM

## 2016-03-16 MED ORDER — PREDNISONE 50 MG PO TABS: ORAL_TABLET | ORAL | 0 refills | Status: DC

## 2016-03-16 MED ORDER — FAMOTIDINE IN NACL 20-0.9 MG/50ML-% IV SOLN
20.0000 mg | Freq: Once | INTRAVENOUS | Status: AC
  Administered 2016-03-16: 20 mg via INTRAVENOUS
  Filled 2016-03-16: qty 50

## 2016-03-16 MED ORDER — METHYLPREDNISOLONE SODIUM SUCC 125 MG IJ SOLR
125.0000 mg | Freq: Once | INTRAMUSCULAR | Status: AC
  Administered 2016-03-16: 125 mg via INTRAVENOUS
  Filled 2016-03-16: qty 2

## 2016-03-16 MED ORDER — DIPHENHYDRAMINE HCL 50 MG/ML IJ SOLN
25.0000 mg | Freq: Once | INTRAMUSCULAR | Status: AC
  Administered 2016-03-16: 25 mg via INTRAVENOUS
  Filled 2016-03-16: qty 1

## 2016-03-16 NOTE — ED Notes (Signed)
Called x 1 no answer

## 2016-03-16 NOTE — ED Notes (Signed)
Pt states he feels better & not itching at this time.

## 2016-03-16 NOTE — ED Notes (Signed)
Called x 2 no answer

## 2016-03-16 NOTE — ED Notes (Signed)
Pt alert & oriented x4, stable gait. Patient given discharge instructions, paperwork & prescription(s). Patient  instructed to stop at the registration desk to finish any additional paperwork. Patient verbalized understanding. Pt left department w/ no further questions. 

## 2016-03-16 NOTE — ED Provider Notes (Signed)
[facility]-EMERGENCY DEPT Provider Note   CSN: [id number] Arrival date & time: 03/16/16  0520     History   Chief Complaint Chief Complaint  Patient presents with  . Allergic Reaction    HPI Danny Mathis is a 30 y.o. male.  The history is provided by the patient.  Allergic Reaction  Presenting symptoms: itching, rash and swelling   Presenting symptoms: no difficulty breathing, no difficulty swallowing and no wheezing   Severity:  Moderate Duration:  1 day Prior allergic episodes:  No prior episodes Relieved by:  Nothing Worsened by:  Nothing Ineffective treatments:  Antihistamines  Patient he was burning "brush" yesterday and then he developed rash throughout his body No sob No face or tongue swelling No abd pain/vomiting/diarrhea He has not had similar reactions recently  Past Medical History:  Diagnosis Date  . ADHD (attention deficit hyperactivity disorder)   . Anxiety   . Depression   . Hypertension   . MDD (major depressive disorder)   . Stroke [facility](HCC) 2013  . TIA (transient ischemic attack)     Patient Active Problem List   Diagnosis Date Noted  . Chest pain 12/12/2015  . Stroke (HCC)   . Hypertension   . Anxiety   . ADHD (attention deficit hyperactivity disorder)   . TIA (transient ischemic attack)     Past Surgical History:  Procedure Laterality Date  . HERNIA REPAIR         Home Medications    Prior to Admission medications   Not on File    Family History Family History  Problem Relation Age of Onset  . Arthritis Mother   . COPD Mother   . Depression Mother   . Hearing loss Mother   . Hyperlipidemia Mother   . Hypertension Mother   . Alcohol abuse Father   . Arthritis Father   . Cancer Father   . Hearing loss Father   . Heart disease Father   . Hyperlipidemia Father   . Hypertension Father   . Diabetes Maternal Grandmother   . Hypertension Maternal Grandmother   . Hypertension Maternal Grandfather   . Diabetes Paternal  Grandmother   . Heart disease Paternal Grandmother   . Hypertension Paternal Grandmother   . Miscarriages / Stillbirths Paternal Grandmother   . Stroke Paternal Grandmother   . Vision loss Paternal Grandmother   . Heart disease Paternal Grandfather   . Hypertension Paternal Grandfather   . Stroke Paternal Grandfather   . Vision loss Paternal Grandfather   . Arthritis Sister   . Hearing loss Sister   . Hyperlipidemia Sister   . Hypertension Sister     Social History Social History  Substance Use Topics  . Smoking status: Current Every Day Smoker    Packs/day: 1.00    Types: Cigarettes  . Smokeless tobacco: Never Used  . Alcohol use No     Allergies   Review of patient's allergies indicates no known allergies.   Review of Systems Review of Systems  Constitutional: Negative for fever.  HENT: Negative for trouble swallowing.   Respiratory: Negative for shortness of breath and wheezing.   Gastrointestinal: Negative for vomiting.  Skin: Positive for itching and rash.  All other systems reviewed and are negative.    Physical Exam Updated Vital Signs BP 139/93 (BP Location: Left Arm)   Pulse 80   Temp 97.7 F (36.5 C) (Oral)   Resp 20   Ht 5\' 11"  (1.803 m)   Wt 88.5 kg  SpO2 99%   BMI 27.20 kg/m   Physical Exam CONSTITUTIONAL: Well developed/well nourished HEAD: Normocephalic/atraumatic EYES: EOMI/PERRL ENMT: Mucous membranes moist, no angioedema noted. NECK: supple no meningeal signs SPINE/BACK:entire spine nontender CV: S1/S2 noted, no murmurs/rubs/gallops noted LUNGS: Lungs are clear to auscultation bilaterally, no apparent distress ABDOMEN: soft, nontender GU:no cva tenderness NEURO: Pt is awake/alert/appropriate, moves all extremitiesx4.  EXTREMITIES: pulses normal/equal, full ROM SKIN: rash throughout chest/back/arms/legs c/w urticaria PSYCH: no abnormalities of mood noted, alert and oriented to situation   ED Treatments / Results  Labs (all  labs ordered are listed, but only abnormal results are displayed) Labs Reviewed - No data to display  EKG  EKG Interpretation None       Radiology No results found.  Procedures Procedures   Medications Ordered in ED Medications  methylPREDNISolone sodium succinate (SOLU-MEDROL) 125 mg/2 mL injection 125 mg (125 mg Intravenous Given 03/16/16 0543)  famotidine (PEPCID) IVPB 20 mg premix (0 mg Intravenous Stopped 03/16/16 0610)  diphenhydrAMINE (BENADRYL) injection 25 mg (25 mg Intravenous Given 03/16/16 0542)     Initial Impression / Assessment and Plan / ED Course  I have reviewed the triage vital signs and the nursing notes.   Clinical Course    Pt improved Rash still present but otherwise well appearing No angioedema/wheezing Will d/c home Will start course of steroids We discussed strict ER return precautions   Final Clinical Impressions(s) / ED Diagnoses   Final diagnoses:  Urticaria    New Prescriptions New Prescriptions   PREDNISONE (DELTASONE) 50 MG TABLET    One tablet PO daily for 5 days     Zadie Rhine, MD 03/16/16 [phone]

## 2016-03-16 NOTE — ED Triage Notes (Signed)
Pt has red raised rash all over body with worsening of symptoms to bilateral arms; pt has swelling to wrists

## 2016-03-23 ENCOUNTER — Emergency Department (HOSPITAL_COMMUNITY)
Admission: EM | Admit: 2016-03-23 | Discharge: 2016-03-24 | Disposition: A | Discharge status: Home / Self Care | Payer: BLUE CROSS/BLUE SHIELD | Attending: Emergency Medicine | Admitting: Emergency Medicine

## 2016-03-23 ENCOUNTER — Encounter (HOSPITAL_COMMUNITY): Payer: Self-pay | Admitting: Emergency Medicine

## 2016-03-23 DIAGNOSIS — R21 Rash and other nonspecific skin eruption: Secondary | ICD-10-CM | POA: Diagnosis present

## 2016-03-23 DIAGNOSIS — Z79899 Other long term (current) drug therapy: Secondary | ICD-10-CM | POA: Insufficient documentation

## 2016-03-23 DIAGNOSIS — T7840XA Allergy, unspecified, initial encounter: Secondary | ICD-10-CM | POA: Diagnosis not present

## 2016-03-23 DIAGNOSIS — F17210 Nicotine dependence, cigarettes, uncomplicated: Secondary | ICD-10-CM | POA: Diagnosis not present

## 2016-03-23 DIAGNOSIS — I10 Essential (primary) hypertension: Secondary | ICD-10-CM | POA: Diagnosis not present

## 2016-03-23 DIAGNOSIS — F909 Attention-deficit hyperactivity disorder, unspecified type: Secondary | ICD-10-CM | POA: Diagnosis not present

## 2016-03-23 DIAGNOSIS — Z8673 Personal history of transient ischemic attack (TIA), and cerebral infarction without residual deficits: Secondary | ICD-10-CM | POA: Insufficient documentation

## 2016-03-23 MED ORDER — METHYLPREDNISOLONE SODIUM SUCC 125 MG IJ SOLR
125.0000 mg | Freq: Once | INTRAMUSCULAR | Status: AC
  Administered 2016-03-23: 125 mg via INTRAVENOUS
  Filled 2016-03-23: qty 2

## 2016-03-23 MED ORDER — EPINEPHRINE 0.3 MG/0.3ML IJ SOAJ: 0.3000 mg | Freq: Once | INTRAMUSCULAR | 0 refills | Status: AC

## 2016-03-23 MED ORDER — SODIUM CHLORIDE 0.9 % IV BOLUS (SEPSIS)
1000.0000 mL | Freq: Once | INTRAVENOUS | Status: AC
  Administered 2016-03-23: 1000 mL via INTRAVENOUS

## 2016-03-23 MED ORDER — PREDNISONE 50 MG PO TABS: 50.0000 mg | ORAL_TABLET | Freq: Every day | ORAL | 0 refills | Status: AC

## 2016-03-23 MED ORDER — SODIUM CHLORIDE 0.9 % IV SOLN: INTRAVENOUS | Status: DC

## 2016-03-23 MED ORDER — FAMOTIDINE IN NACL 20-0.9 MG/50ML-% IV SOLN
20.0000 mg | Freq: Once | INTRAVENOUS | Status: AC
  Administered 2016-03-23: 20 mg via INTRAVENOUS
  Filled 2016-03-23: qty 50

## 2016-03-23 NOTE — ED Notes (Signed)
Pt requesting a sprite

## 2016-03-23 NOTE — ED Triage Notes (Signed)
Pt reports allergic reaction to unknown substance. Pt was here 1 week ago for same. Pt with redness and itching, generalized. No stridor heard. O2 sat 95%. Pt states he took 2 Benadryl approx 45 minutes ago.

## 2016-03-23 NOTE — ED Notes (Signed)
Pt sleeping. 

## 2016-03-23 NOTE — ED Provider Notes (Signed)
[facility] DEPT Provider Note   CSN: [id number] Arrival date & time: 03/23/16  2033  By signing my name below, I, Danny Mathis, attest that this documentation has been prepared under the direction and in the presence of Danny Scales, MD . Electronically Signed: Nelwyn Mathis, Scribe. 03/23/2016. 8:53 PM.  History   Chief Complaint Chief Complaint  Patient presents with  . Allergic Reaction   HPI  HPI Comments:  Danny Mathis is a 30 y.o. male who presents to the Emergency Department complaining of sudden-onset constant unchanged rash beginning about an hour ago. 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 He reports that he has been working in the house the rest of the week without any issues, but he was around more chemicals today. Pt reports that he has taken 2 benadryl, 1 amoxicillin, 1 ibuprofen and 1 prednisone prior to arrival with no relief. He states that he took the amoxicillin before the reaction for some tooth pain he has been experiencing, but has taken amoxicillin in the past without any adverse reactions.  Pt reports associated facial swelling, redness, and itching. He denies any SOB or changes in diet.   Past Medical History:  Diagnosis Date  . ADHD (attention deficit hyperactivity disorder)   . Anxiety   . Depression   . Hypertension   . MDD (major depressive disorder)   . Stroke [facility]) 2013  . TIA (transient ischemic attack)     Patient Active Problem List   Diagnosis Date Noted  . Chest pain 12/12/2015  . Stroke (HCC)   . Hypertension   . Anxiety   . ADHD (attention deficit hyperactivity disorder)   . TIA (transient ischemic attack)     Past Surgical History:  Procedure Laterality Date  . HERNIA REPAIR      Home Medications    Prior to Admission medications   Medication Sig Start Date End Date Taking? Authorizing Provider  predniSONE (DELTASONE) 50 MG  tablet One tablet PO daily for 5 days 03/16/16   Zadie Rhine, MD    Family History Family History  Problem Relation Age of Onset  . Arthritis Mother   . COPD Mother   . Depression Mother   . Hearing loss Mother   . Hyperlipidemia Mother   . Hypertension Mother   . Alcohol abuse Father   . Arthritis Father   . Cancer Father   . Hearing loss Father   . Heart disease Father   . Hyperlipidemia Father   . Hypertension Father   . Diabetes Maternal Grandmother   . Hypertension Maternal Grandmother   . Hypertension Maternal Grandfather   . Diabetes Paternal Grandmother   . Heart disease Paternal Grandmother   . Hypertension Paternal Grandmother   . Miscarriages / Stillbirths Paternal Grandmother   . Stroke Paternal Grandmother   . Vision loss Paternal Grandmother   . Heart disease Paternal Grandfather   . Hypertension Paternal Grandfather   . Stroke Paternal Grandfather   . Vision loss Paternal Grandfather   . Arthritis Sister   . Hearing loss Sister   . Hyperlipidemia Sister   . Hypertension Sister     Social History Social History  Substance Use Topics  . Smoking status: Current Every Day Smoker    Packs/day: 1.00    Types: Cigarettes  . Smokeless tobacco: Never Used  . Alcohol use No     Allergies  Review of patient's allergies indicates no known allergies.   Review of Systems Review of Systems  HENT: Positive for facial swelling.   Respiratory: Negative for shortness of breath.   Skin: Positive for color change and rash.       Positive for Itchiness  All other systems reviewed and are negative.    Physical Exam Updated Vital Signs BP 141/82 (BP Location: Left Arm)   Pulse 112   Temp 97.7 F (36.5 C) (Oral)   Resp 25   Ht 5\' 11"  (1.803 m)   Wt 195 lb (88.5 kg)   SpO2 95%   BMI 27.20 kg/m   Physical Exam  Constitutional: He is oriented to person, place, and time. He appears well-developed and well-nourished.  HENT:  Head: Normocephalic and  atraumatic.  No swelling of posterior throat or palate.  Eyes: EOM are normal.  Bialteral conjunctiva injection  Neck: Normal range of motion. No JVD present.  Cardiovascular: Normal rate, regular rhythm, normal heart sounds and intact distal pulses.   Pulmonary/Chest: Effort normal and breath sounds normal. No respiratory distress.  Lungs clear  Abdominal: Soft. He exhibits no distension. There is no tenderness.  Nontender 2+ pulses.  Musculoskeletal: Normal range of motion.  Neurological: He is alert and oriented to person, place, and time.  Skin: Skin is warm and dry. Rash noted.  Diffuse blanching rash.  Psychiatric: He has a normal mood and affect. Judgment normal.  Nursing note and vitals reviewed.    ED Treatments / Results  DIAGNOSTIC STUDIES:  Oxygen Saturation is 95% on RA, normal by my interpretation.    COORDINATION OF CARE:  9:06 PM Discussed treatment plan with pt at bedside which includes pepcid and solumedrol and pt agreed to plan.  Labs (all labs ordered are listed, but only abnormal results are displayed) Labs Reviewed - No data to display  EKG  EKG Interpretation None       Radiology No results found.  Procedures Procedures (including critical care time)  Medications Ordered in ED Medications  sodium chloride 0.9 % bolus 1,000 mL (0 mLs Intravenous Stopped 03/23/16 2232)  famotidine (PEPCID) IVPB 20 mg premix (0 mg Intravenous Stopped 03/23/16 2210)  methylPREDNISolone sodium succinate (SOLU-MEDROL) 125 mg/2 mL injection 125 mg (125 mg Intravenous Given 03/23/16 2123)     Initial Impression / Assessment and Plan / ED Course  I have reviewed the triage vital signs and the nursing notes.  Pertinent labs & imaging results that were available during my care of the patient were reviewed by me and considered in my medical decision making (see chart for details).  Clinical Course    Danny SalvageJames L Mathis is a 30 y.o. male who presents to the  Emergency Department complaining of sudden-onset constant unchanged pruritic rash beginning about an hour ago.  History and exam are seen above.  Patient has diffuse blanching rash all over body. Patient's lungs are clear, oropharyngeal exam is a unremarkable, Patient has no stridor, or abdominal pain.  Patient given Solu-Medrol and Pepcid to go with his Benadryl he took at home.  Patient was observed for. Time with complete resolution in rash and itching. Patient never developed respiratory symptoms. Epinephrine was withheld.  Multiple causes of rash were suspected. Patient reports going to the same job site with chemicals as the day he had his last rash. He also reports that earlier today he took amoxicillin for dental pain, however he is unsure if he took it last time. He also reports that  he did not take his full course of prednisone as directed since last visit.  Patient was instructed to avoid that jobsite, avoid amoxicillin, and take five days of prednisone even if his rash appears to cleared up this week. Patient was instructed to follow up with a PCP for allergy testing. Patient was given return precautions for a new or worsening symptoms. Patient was given a prescription for EpiPen as this rash progressed more quickly than his last rash and his reactions might be getting stronger.  Patient and wife had no other questions or concerns and patient was discharged in good condition with resolution of presenting symptoms.  Final Clinical Impressions(s) / ED Diagnoses   Final diagnoses:  Allergic reaction, initial encounter    New Prescriptions New Prescriptions   No medications on file   I personally performed the services described in this documentation, which was scribed in my presence. The recorded information has been reviewed and is accurate.  Clinical Impression: 1. Allergic reaction, initial encounter     Disposition: Discharge  Condition: Good  I have discussed the  results, Dx and Tx plan with the pt(& family if present). He/she/they expressed understanding and agree(s) with the plan. Discharge instructions discussed at great length. Strict return precautions discussed and pt &/or family have verbalized understanding of the instructions. No further questions at time of discharge.    Discharge Medication List as of 03/23/2016 11:59 PM    START taking these medications   Details  EPINEPHrine 0.3 mg/0.3 mL IJ SOAJ injection Inject 0.3 mLs (0.3 mg total) into the muscle once., Starting Wed 03/24/2016, Print    !! predniSONE (DELTASONE) 50 MG tablet Take 1 tablet (50 mg total) by mouth daily., Starting Tue 03/23/2016, Until Sun 03/28/2016, Print     !! - Potential duplicate medications found. Please discuss with provider.      Follow Up: Salley Scarlet, MD [address] [place] [address] [phone]         Danny Scales, MD 03/24/16 1155

## 2016-04-05 ENCOUNTER — Ambulatory Visit: Payer: BLUE CROSS/BLUE SHIELD | Admitting: Physician Assistant

## 2016-04-08 ENCOUNTER — Ambulatory Visit: Payer: BLUE CROSS/BLUE SHIELD | Admitting: Physician Assistant

## 2016-04-15 ENCOUNTER — Ambulatory Visit: Payer: BLUE CROSS/BLUE SHIELD | Admitting: Physician Assistant

## 2016-04-16 ENCOUNTER — Ambulatory Visit: Payer: Self-pay | Admitting: Family Medicine

## 2016-04-16 NOTE — Progress Notes (Deleted)
   Subjective:    Patient ID: Danny SalvageJames L Valladolid, male    DOB: 04/13/1986, 30 y.o.   MRN: [id number]  Patient presents for No chief complaint on file. Here for follow-up he establish care back in January at that time he was having intermittent neurological episodes concerning for transient ischemic attacks. He's also had some episodes of chest pain. It is noted that the attacks were worse when he was drinking more alcohol. He's had MRI MRA which was negative. He was referred to cardiology he was able to see them about 5 months ago he had cardiac stress testing done echocardiogram which were fairly normal he had mild enlargement in his left ventricle he had heart monitor placed this showed some PACs and he was advised to decrease his caffeine otherwise everything else was negative.  He was also seen in the emergency room on October 17 and then about a week later secondary to allergic reaction. Is unclear. Allergic to something on his job site as he does have contact with multiple chemicals versus something and some brush. He also had taken some amoxicillin because of dental infection but has taken this in the past without any difficulties. He was treated with prednisone on both occasions. He was also given an EpiPen as he had rapid progression of the rash the second time     Review Of Systems:  GEN- denies fatigue, fever, weight loss,weakness, recent illness HEENT- denies eye drainage, change in vision, nasal discharge, CVS- denies chest pain, palpitations RESP- denies SOB, cough, wheeze ABD- denies N/V, change in stools, abd pain GU- denies dysuria, hematuria, dribbling, incontinence MSK- denies joint pain, muscle aches, injury Neuro- denies headache, dizziness, syncope, seizure activity       Objective:    There were no vitals taken for this visit. GEN- NAD, alert and oriented x3 HEENT- PERRL, EOMI, non injected sclera, pink conjunctiva, MMM, oropharynx clear Neck- Supple, no  thyromegaly CVS- RRR, no murmur RESP-CTAB ABD-NABS,soft,NT,ND EXT- No edema Pulses- Radial, DP- 2+        Assessment & Plan:      Problem List Items Addressed This Visit    None      Note: This dictation was prepared with Dragon dictation along with smaller phrase technology. Any transcriptional errors that result from this process are unintentional.

## 2016-08-11 ENCOUNTER — Encounter: Payer: Self-pay | Admitting: Family Medicine

## 2016-08-11 ENCOUNTER — Ambulatory Visit (INDEPENDENT_AMBULATORY_CARE_PROVIDER_SITE_OTHER): Payer: BLUE CROSS/BLUE SHIELD | Admitting: Family Medicine

## 2016-08-11 VITALS — BP 136/82 | HR 78 | Temp 98.4°F | Resp 14 | Ht 69.5 in | Wt 240.0 lb

## 2016-08-11 DIAGNOSIS — F419 Anxiety disorder, unspecified: Secondary | ICD-10-CM

## 2016-08-11 DIAGNOSIS — F410 Panic disorder [episodic paroxysmal anxiety] without agoraphobia: Secondary | ICD-10-CM | POA: Diagnosis not present

## 2016-08-11 MED ORDER — SERTRALINE HCL 50 MG PO TABS: 50.0000 mg | ORAL_TABLET | Freq: Every day | ORAL | 3 refills | Status: DC

## 2016-08-11 MED ORDER — HYDROXYZINE HCL 10 MG PO TABS: 10.0000 mg | ORAL_TABLET | Freq: Three times a day (TID) | ORAL | 1 refills | Status: DC | PRN

## 2016-08-11 NOTE — Patient Instructions (Signed)
Start zoloft  Take hydroxyzine as needed  F/U 4 weeks
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
## 2016-09-08 ENCOUNTER — Ambulatory Visit: Payer: BLUE CROSS/BLUE SHIELD | Admitting: Family Medicine

## 2016-09-15 ENCOUNTER — Ambulatory Visit: Payer: BLUE CROSS/BLUE SHIELD | Admitting: Family Medicine

## 2016-09-24 ENCOUNTER — Encounter: Payer: Self-pay | Admitting: Family Medicine

## 2017-02-02 ENCOUNTER — Encounter: Payer: Self-pay | Admitting: Family Medicine

## 2017-10-07 ENCOUNTER — Emergency Department (HOSPITAL_COMMUNITY)
Admission: EM | Admit: 2017-10-07 | Discharge: 2017-10-07 | Disposition: A | Discharge status: Home / Self Care | Payer: BLUE CROSS/BLUE SHIELD | Attending: Emergency Medicine | Admitting: Emergency Medicine

## 2017-10-07 ENCOUNTER — Other Ambulatory Visit: Payer: Self-pay

## 2017-10-07 ENCOUNTER — Encounter (HOSPITAL_COMMUNITY): Payer: Self-pay | Admitting: Emergency Medicine

## 2017-10-07 DIAGNOSIS — I10 Essential (primary) hypertension: Secondary | ICD-10-CM | POA: Diagnosis not present

## 2017-10-07 DIAGNOSIS — Z79899 Other long term (current) drug therapy: Secondary | ICD-10-CM | POA: Insufficient documentation

## 2017-10-07 DIAGNOSIS — F17210 Nicotine dependence, cigarettes, uncomplicated: Secondary | ICD-10-CM | POA: Insufficient documentation

## 2017-10-07 DIAGNOSIS — K0889 Other specified disorders of teeth and supporting structures: Secondary | ICD-10-CM | POA: Insufficient documentation

## 2017-10-07 MED ORDER — TRAMADOL HCL 50 MG PO TABS: 100.0000 mg | ORAL_TABLET | Freq: Once | ORAL | Status: DC

## 2017-10-07 MED ORDER — CLINDAMYCIN HCL 150 MG PO CAPS: 300.0000 mg | ORAL_CAPSULE | Freq: Four times a day (QID) | ORAL | 0 refills | Status: DC

## 2017-10-07 MED ORDER — IBUPROFEN 800 MG PO TABS: 800.0000 mg | ORAL_TABLET | Freq: Three times a day (TID) | ORAL | 0 refills | Status: DC

## 2017-10-07 MED ORDER — CLINDAMYCIN HCL 150 MG PO CAPS
300.0000 mg | ORAL_CAPSULE | Freq: Once | ORAL | Status: AC
  Administered 2017-10-07: 300 mg via ORAL
  Filled 2017-10-07: qty 2

## 2017-10-07 MED ORDER — TRAMADOL HCL 50 MG PO TABS: 50.0000 mg | ORAL_TABLET | Freq: Four times a day (QID) | ORAL | 0 refills | Status: DC | PRN

## 2017-10-07 NOTE — ED Triage Notes (Signed)
Pt c/o dental pain, neck pain and ear pain x 1 day but off and on for 3 years, pt c/o "busted teeth" that he has no money to pay for extractions

## 2017-10-07 NOTE — ED Provider Notes (Signed)
[facility] EMERGENCY DEPARTMENT Provider Note   CSN: [id number] Arrival date & time: 10/07/17  0132     History   Chief Complaint Chief Complaint  Patient presents with  . Dental Pain    HPI Danny Mathis is a 32 y.o. male.  Patient presents to the ER for evaluation of toothache.  Patient reports he has been having problems on and off for 3 years, but in the last 1 or 2 days pain has returned.  He has noticed pain in the left side of his jaw that radiates up into the ear and down into the neck area.  He has noticed some swelling under the jaw.  No fever.     Past Medical History:  Diagnosis Date  . ADHD (attention deficit hyperactivity disorder)   . Anxiety   . Depression   . Hypertension   . MDD (major depressive disorder)   . Stroke [facility]) 2013  . TIA (transient ischemic attack)     Patient Active Problem List   Diagnosis Date Noted  . Panic attacks 08/11/2016  . Chest pain 12/12/2015  . Stroke (HCC)   . Hypertension   . Anxiety   . ADHD (attention deficit hyperactivity disorder)   . TIA (transient ischemic attack)     Past Surgical History:  Procedure Laterality Date  . HERNIA REPAIR          Home Medications    Prior to Admission medications   Medication Sig Start Date End Date Taking? Authorizing Provider  clindamycin (CLEOCIN) 150 MG capsule Take 2 capsules (300 mg total) by mouth 4 (four) times daily. 10/07/17   Gilda Crease, MD  hydrOXYzine (ATARAX/VISTARIL) 10 MG tablet Take 1 tablet (10 mg total) by mouth 3 (three) times daily as needed. 08/11/16   Salley Scarlet, MD  ibuprofen (ADVIL,MOTRIN) 800 MG tablet Take 1 tablet (800 mg total) by mouth 3 (three) times daily. 10/07/17   Gilda Crease, MD  sertraline (ZOLOFT) 50 MG tablet Take 1 tablet (50 mg total) by mouth daily. 08/11/16   Orovada, Velna Hatchet, MD  traMADol (ULTRAM) 50 MG tablet Take 1 tablet (50 mg total) by mouth every 6 (six) hours as needed. 10/07/17   Gilda Crease, MD    Family History Family History  Problem Relation Age of Onset  . Arthritis Mother   . COPD Mother   . Depression Mother   . Hearing loss Mother   . Hyperlipidemia Mother   . Hypertension Mother   . Alcohol abuse Father   . Arthritis Father   . Cancer Father   . Hearing loss Father   . Heart disease Father   . Hyperlipidemia Father   . Hypertension Father   . Diabetes Maternal Grandmother   . Hypertension Maternal Grandmother   . Hypertension Maternal Grandfather   . Diabetes Paternal Grandmother   . Heart disease Paternal Grandmother   . Hypertension Paternal Grandmother   . Miscarriages / Stillbirths Paternal Grandmother   . Stroke Paternal Grandmother   . Vision loss Paternal Grandmother   . Heart disease Paternal Grandfather   . Hypertension Paternal Grandfather   . Stroke Paternal Grandfather   . Vision loss Paternal Grandfather   . Arthritis Sister   . Hearing loss Sister   . Hyperlipidemia Sister   . Hypertension Sister     Social History Social History   Tobacco Use  . Smoking status: Current Every Day Smoker    Packs/day: 1.00  Types: Cigarettes  . Smokeless tobacco: Never Used  Substance Use Topics  . Alcohol use: Yes    Comment: occ  . Drug use: No    Comment: pt denies     Allergies   Amoxicillin   Review of Systems Review of Systems  HENT: Positive for dental problem.   All other systems reviewed and are negative.    Physical Exam Updated Vital Signs There were no vitals taken for this visit.  Physical Exam  Constitutional: He is oriented to person, place, and time. He appears well-developed and well-nourished. No distress.  HENT:  Head: Normocephalic and atraumatic.  Right Ear: Hearing normal.  Left Ear: Hearing normal.  Nose: Nose normal.  Mouth/Throat: Oropharynx is clear and moist and mucous membranes are normal. Dental caries present.    Slight fullness and swelling left submandibular region - no  discreet mass  Eyes: Pupils are equal, round, and reactive to light. Conjunctivae and EOM are normal.  Neck: Normal range of motion. Neck supple.  Cardiovascular: Regular rhythm, S1 normal and S2 normal. Exam reveals no gallop and no friction rub.  No murmur heard. Pulmonary/Chest: Effort normal and breath sounds normal. No respiratory distress. He exhibits no tenderness.  Abdominal: Soft. Normal appearance and bowel sounds are normal. There is no hepatosplenomegaly. There is no tenderness. There is no rebound, no guarding, no tenderness at McBurney's point and negative Murphy's sign. No hernia.  Musculoskeletal: Normal range of motion.  Neurological: He is alert and oriented to person, place, and time. He has normal strength. No cranial nerve deficit or sensory deficit. Coordination normal. GCS eye subscore is 4. GCS verbal subscore is 5. GCS motor subscore is 6.  Skin: Skin is warm, dry and intact. No rash noted. No cyanosis.  Psychiatric: He has a normal mood and affect. His speech is normal and behavior is normal. Thought content normal.  Nursing note and vitals reviewed.    ED Treatments / Results  Labs (all labs ordered are listed, but only abnormal results are displayed) Labs Reviewed - No data to display  EKG None  Radiology No results found.  Procedures Procedures (including critical care time)  Medications Ordered in ED Medications  clindamycin (CLEOCIN) capsule 300 mg (has no administration in time range)  traMADol (ULTRAM) tablet 100 mg (has no administration in time range)     Initial Impression / Assessment and Plan / ED Course  I have reviewed the triage vital signs and the nursing notes.  Pertinent labs & imaging results that were available during my care of the patient were reviewed by me and considered in my medical decision making (see chart for details).     Patient with dental pain, swelling of the left submandibular area without discrete mass.  No  concern for Ludwig's angina or neck abscess. Final Clinical Impressions(s) / ED Diagnoses   Final diagnoses:  Pain, dental    ED Discharge Orders        Ordered    clindamycin (CLEOCIN) 150 MG capsule  4 times daily     10/07/17 0141    traMADol (ULTRAM) 50 MG tablet  Every 6 hours PRN     10/07/17 0141    ibuprofen (ADVIL,MOTRIN) 800 MG tablet  3 times daily     10/07/17 0141       Gilda Crease, MD 10/07/17 [phone]

## 2017-12-10 ENCOUNTER — Other Ambulatory Visit: Payer: Self-pay

## 2017-12-10 ENCOUNTER — Encounter (HOSPITAL_COMMUNITY): Payer: Self-pay

## 2017-12-10 ENCOUNTER — Emergency Department (HOSPITAL_COMMUNITY)
Admission: EM | Admit: 2017-12-10 | Discharge: 2017-12-10 | Disposition: A | Discharge status: Home / Self Care | Payer: BLUE CROSS/BLUE SHIELD | Attending: Emergency Medicine | Admitting: Emergency Medicine

## 2017-12-10 DIAGNOSIS — K1120 Sialoadenitis, unspecified: Secondary | ICD-10-CM | POA: Insufficient documentation

## 2017-12-10 DIAGNOSIS — Z8673 Personal history of transient ischemic attack (TIA), and cerebral infarction without residual deficits: Secondary | ICD-10-CM | POA: Insufficient documentation

## 2017-12-10 DIAGNOSIS — R220 Localized swelling, mass and lump, head: Secondary | ICD-10-CM | POA: Diagnosis present

## 2017-12-10 DIAGNOSIS — F17210 Nicotine dependence, cigarettes, uncomplicated: Secondary | ICD-10-CM | POA: Diagnosis not present

## 2017-12-10 DIAGNOSIS — I10 Essential (primary) hypertension: Secondary | ICD-10-CM | POA: Diagnosis not present

## 2017-12-10 DIAGNOSIS — F329 Major depressive disorder, single episode, unspecified: Secondary | ICD-10-CM | POA: Diagnosis not present

## 2017-12-10 DIAGNOSIS — F909 Attention-deficit hyperactivity disorder, unspecified type: Secondary | ICD-10-CM | POA: Insufficient documentation

## 2017-12-10 DIAGNOSIS — F419 Anxiety disorder, unspecified: Secondary | ICD-10-CM | POA: Diagnosis not present

## 2017-12-10 LAB — CBC WITH DIFFERENTIAL/PLATELET
BASOS ABS: 0 10*3/uL (ref 0.0–0.1)
Basophils Relative: 0 %
Eosinophils Absolute: 0.1 10*3/uL (ref 0.0–0.7)
Eosinophils Relative: 1 %
HCT: 47.7 % (ref 39.0–52.0)
HEMOGLOBIN: 15.8 g/dL (ref 13.0–17.0)
LYMPHS ABS: 2.1 10*3/uL (ref 0.7–4.0)
LYMPHS PCT: 33 %
MCH: 29.5 pg (ref 26.0–34.0)
MCHC: 33.1 g/dL (ref 30.0–36.0)
MCV: 89 fL (ref 78.0–100.0)
Monocytes Absolute: 0.6 10*3/uL (ref 0.1–1.0)
Monocytes Relative: 9 %
NEUTROS PCT: 57 %
Neutro Abs: 3.5 10*3/uL (ref 1.7–7.7)
PLATELETS: 236 10*3/uL (ref 150–400)
RBC: 5.36 MIL/uL (ref 4.22–5.81)
RDW: 13 % (ref 11.5–15.5)
WBC: 6.3 10*3/uL (ref 4.0–10.5)

## 2017-12-10 LAB — BASIC METABOLIC PANEL
ANION GAP: 9 (ref 5–15)
BUN: 9 mg/dL (ref 6–20)
CALCIUM: 9.3 mg/dL (ref 8.9–10.3)
CO2: 24 mmol/L (ref 22–32)
CREATININE: 0.82 mg/dL (ref 0.61–1.24)
Chloride: 105 mmol/L (ref 98–111)
GFR calc non Af Amer: >60 mL/min (ref >60)
Glucose, Bld: 94 mg/dL (ref 70–99)
Potassium: 4.2 mmol/L (ref 3.5–5.1)
SODIUM: 138 mmol/L (ref 135–145)

## 2017-12-10 MED ORDER — SODIUM CHLORIDE 0.9 % IV BOLUS
1000.0000 mL | Freq: Once | INTRAVENOUS | Status: AC
  Administered 2017-12-10: 1000 mL via INTRAVENOUS

## 2017-12-10 MED ORDER — CLINDAMYCIN HCL 150 MG PO CAPS: 150.0000 mg | ORAL_CAPSULE | Freq: Four times a day (QID) | ORAL | 0 refills | Status: DC

## 2017-12-10 MED ORDER — METHYLPREDNISOLONE SODIUM SUCC 125 MG IJ SOLR
125.0000 mg | Freq: Once | INTRAMUSCULAR | Status: AC
  Administered 2017-12-10: 125 mg via INTRAVENOUS
  Filled 2017-12-10: qty 2

## 2017-12-10 MED ORDER — OXYCODONE-ACETAMINOPHEN 5-325 MG PO TABS: 1.0000 | ORAL_TABLET | ORAL | 0 refills | Status: DC | PRN

## 2017-12-10 MED ORDER — ONDANSETRON HCL 4 MG/2ML IJ SOLN
4.0000 mg | Freq: Once | INTRAMUSCULAR | Status: AC
  Administered 2017-12-10: 4 mg via INTRAVENOUS
  Filled 2017-12-10: qty 2

## 2017-12-10 MED ORDER — PREDNISONE 10 MG PO TABS: 20.0000 mg | ORAL_TABLET | Freq: Every day | ORAL | 0 refills | Status: DC

## 2017-12-10 MED ORDER — KETOROLAC TROMETHAMINE 30 MG/ML IJ SOLN
30.0000 mg | Freq: Once | INTRAMUSCULAR | Status: AC
  Administered 2017-12-10: 30 mg via INTRAVENOUS
  Filled 2017-12-10: qty 1

## 2017-12-10 NOTE — ED Triage Notes (Signed)
Patient c/o left side jaw, neck, ear, and throat pain. Per patient has a salivary gland stone in which he has a hx of. Patient states pain started Wednesday and progressively getting worse. Per patient took hydrocodone with some relief-last took yesterday. Patient states has appointment for surgery in August 7.

## 2017-12-10 NOTE — Discharge Instructions (Addendum)
Prescription for antibiotic, pain medicine, prednisone.  Call your doctor's office on Monday and ask for an early appointment.

## 2017-12-11 NOTE — ED Provider Notes (Signed)
[facility] EMERGENCY DEPARTMENT Provider Note   CSN: [id number] Arrival date & time: 12/10/17  1213     History   Chief Complaint Chief Complaint  Patient presents with  . Jaw Pain    HPI Danny Mathis is a 32 y.o. male.  Patient with a known history of left parotid sialolithiasis presents with persistent pain and swelling in this same area.  He is already had one surgical procedure to release a parotid stone.  He is scheduled to see the otolaryngologist on August 7, but feels he cannot wait.  No fever, sweats, chills, stiff neck, neuro deficits.  Severity of pain is moderate.  Palpation makes pain worse.     Past Medical History:  Diagnosis Date  . ADHD (attention deficit hyperactivity disorder)   . Anxiety   . Depression   . Hypertension   . MDD (major depressive disorder)   . Stroke [facility]) 2013  . TIA (transient ischemic attack)     Patient Active Problem List   Diagnosis Date Noted  . Panic attacks 08/11/2016  . Chest pain 12/12/2015  . Stroke (HCC)   . Hypertension   . Anxiety   . ADHD (attention deficit hyperactivity disorder)   . TIA (transient ischemic attack)     Past Surgical History:  Procedure Laterality Date  . HERNIA REPAIR          Home Medications    Prior to Admission medications   Medication Sig Start Date End Date Taking? Authorizing Provider  acetaminophen (TYLENOL) 500 MG tablet Take 1,000 mg by mouth every 6 (six) hours as needed for mild pain.   Yes [provider]  HYDROcodone-acetaminophen (NORCO/VICODIN) 5-325 MG tablet Take 1 tablet by mouth every 6 (six) hours as needed for moderate pain.  10/19/17  Yes [provider]  ibuprofen (ADVIL,MOTRIN) 200 MG tablet Take 400 mg by mouth every 6 (six) hours as needed for moderate pain.   Yes [provider]  clindamycin (CLEOCIN) 150 MG capsule Take 1 capsule (150 mg total) by mouth every 6 (six) hours. 12/10/17   Danny Hutching, MD  ibuprofen (ADVIL,MOTRIN) 800  MG tablet Take 1 tablet (800 mg total) by mouth 3 (three) times daily. 10/07/17   Gilda Crease, MD  oxyCODONE-acetaminophen (PERCOCET) 5-325 MG tablet Take 1 tablet by mouth every 4 (four) hours as needed. 12/10/17   Danny Hutching, MD  predniSONE (DELTASONE) 10 MG tablet Take 2 tablets (20 mg total) by mouth daily. 12/10/17   Danny Hutching, MD  traMADol (ULTRAM) 50 MG tablet Take 1 tablet (50 mg total) by mouth every 6 (six) hours as needed. 10/07/17   Gilda Crease, MD    Family History Family History  Problem Relation Age of Onset  . Arthritis Mother   . COPD Mother   . Depression Mother   . Hearing loss Mother   . Hyperlipidemia Mother   . Hypertension Mother   . Alcohol abuse Father   . Arthritis Father   . Cancer Father   . Hearing loss Father   . Heart disease Father   . Hyperlipidemia Father   . Hypertension Father   . Diabetes Maternal Grandmother   . Hypertension Maternal Grandmother   . Hypertension Maternal Grandfather   . Diabetes Paternal Grandmother   . Heart disease Paternal Grandmother   . Hypertension Paternal Grandmother   . Miscarriages / Stillbirths Paternal Grandmother   . Stroke Paternal Grandmother   . Vision loss Paternal Grandmother   .  Heart disease Paternal Grandfather   . Hypertension Paternal Grandfather   . Stroke Paternal Grandfather   . Vision loss Paternal Grandfather   . Arthritis Sister   . Hearing loss Sister   . Hyperlipidemia Sister   . Hypertension Sister     Social History Social History   Tobacco Use  . Smoking status: Current Every Day Smoker    Packs/day: 1.00    Types: Cigarettes  . Smokeless tobacco: Never Used  Substance Use Topics  . Alcohol use: Not Currently    Comment: occ  . Drug use: No    Comment: pt denies     Allergies   Amoxicillin   Review of Systems Review of Systems  All other systems reviewed and are negative.    Physical Exam Updated Vital Signs BP 136/88   Pulse 77   Temp 98  F (36.7 C) (Oral)   Resp 16   Ht 5\' 10"  (1.778 m)   Wt 90.7 kg (200 lb)   SpO2 100%   BMI 28.70 kg/m   Physical Exam  Constitutional: He is oriented to person, place, and time. He appears well-developed and well-nourished.  Nontoxic-appearing  HENT:  Head: Normocephalic and atraumatic.  Eyes: Conjunctivae are normal.  Neck:  Tender indurated soft tissue in the appropriate area of the parotid gland.  Cardiovascular: Normal rate and regular rhythm.  Pulmonary/Chest: Effort normal and breath sounds normal.  Abdominal: Soft. Bowel sounds are normal.  Musculoskeletal: Normal range of motion.  Neurological: He is alert and oriented to person, place, and time.  Skin: Skin is warm and dry.  Psychiatric: He has a normal mood and affect. His behavior is normal.  Nursing note and vitals reviewed.    ED Treatments / Results  Labs (all labs ordered are listed, but only abnormal results are displayed) Labs Reviewed  CBC WITH DIFFERENTIAL/PLATELET  BASIC METABOLIC PANEL    EKG None  Radiology No results found.  Procedures Procedures (including critical care time)  Medications Ordered in ED Medications  sodium chloride 0.9 % bolus 1,000 mL (0 mLs Intravenous Stopped 12/10/17 1419)  methylPREDNISolone sodium succinate (SOLU-MEDROL) 125 mg/2 mL injection 125 mg (125 mg Intravenous Given 12/10/17 1301)  ketorolac (TORADOL) 30 MG/ML injection 30 mg (30 mg Intravenous Given 12/10/17 1304)  ondansetron (ZOFRAN) injection 4 mg (4 mg Intravenous Given 12/10/17 1303)     Initial Impression / Assessment and Plan / ED Course  I have reviewed the triage vital signs and the nursing notes.  Pertinent labs & imaging results that were available during my care of the patient were reviewed by me and considered in my medical decision making (see chart for details).     Patient presents with a known history of parotid gland stone.  He is not septic.  Labs were reassuring reassuring.  He  responded well to IV steroids, IV Toradol, IV fluids.  He will follow-up with ENT.  Discharge medicines Cleocin 150 mg, prednisone, Percocet.  Final Clinical Impressions(s) / ED Diagnoses   Final diagnoses:  Sialadenitis    ED Discharge Orders        Ordered    clindamycin (CLEOCIN) 150 MG capsule  Every 6 hours     12/10/17 1507    predniSONE (DELTASONE) 10 MG tablet  Daily     12/10/17 1507    oxyCODONE-acetaminophen (PERCOCET) 5-325 MG tablet  Every 4 hours PRN     12/10/17 1507       Danny Mathis, Danny Vossler, MD 12/11/17 [phone]

## 2018-03-30 IMAGING — US US CAROTID DUPLEX BILAT
1 series · 13 of 24 positions shown · non-contrast
Comparison: Head CT - 09/11/2014; brain MRI - 09/11/2014

CLINICAL DATA: Personal history of 3 TIAs.  History of smoking.

EXAM:
BILATERAL CAROTID DUPLEX ULTRASOUND
TECHNIQUE: Gray scale imaging, color Doppler and duplex ultrasound were
performed of bilateral carotid and vertebral arteries in the neck.

[Series 1: us carotid duplex bilat · 0.08mm/px · 13 of 65 slices shown]
[im 1/65]
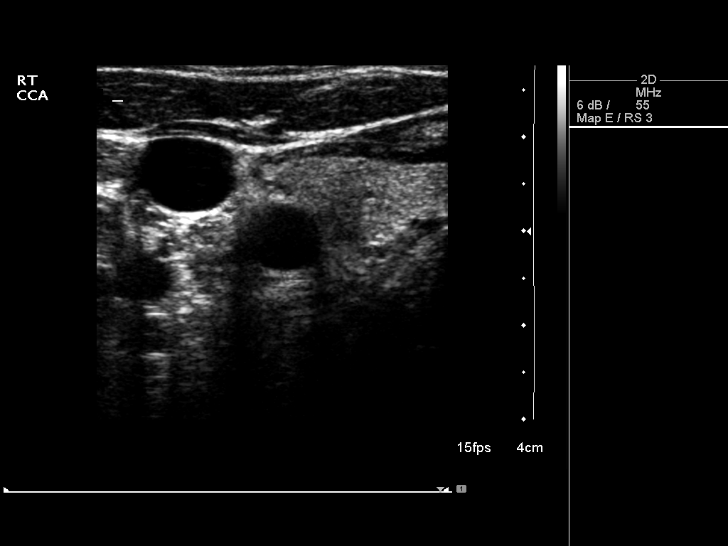
[im 6/65]
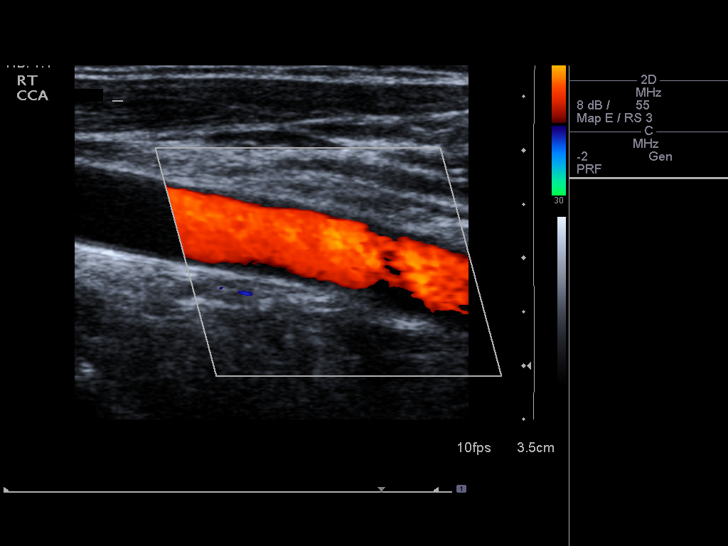
[im 12/65]
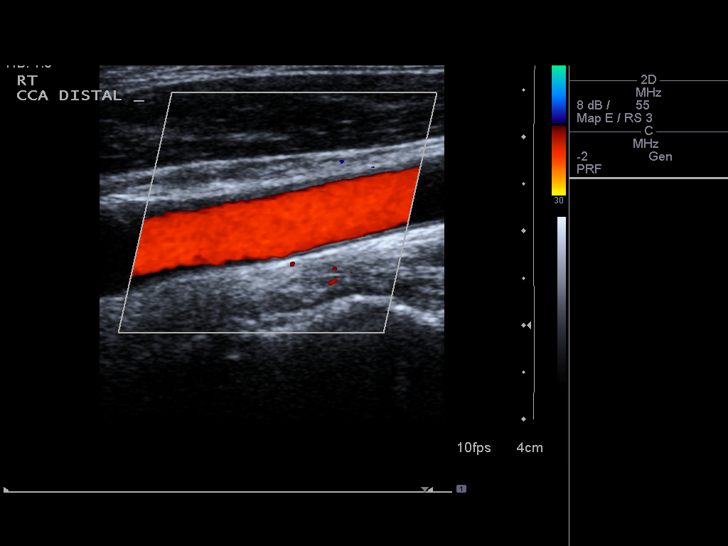
[im 17/65]
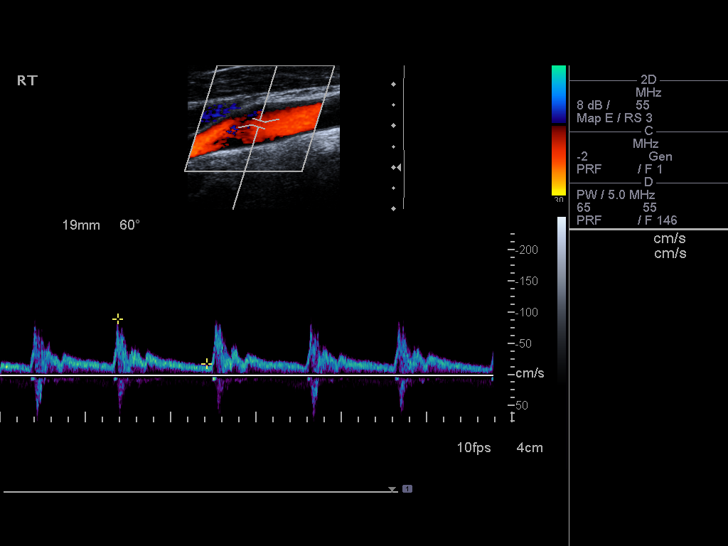
[im 23/65]
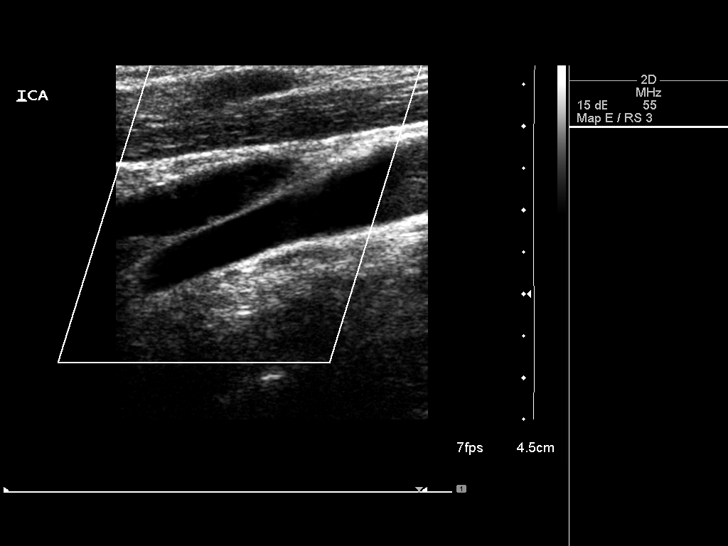
[im 28/65]
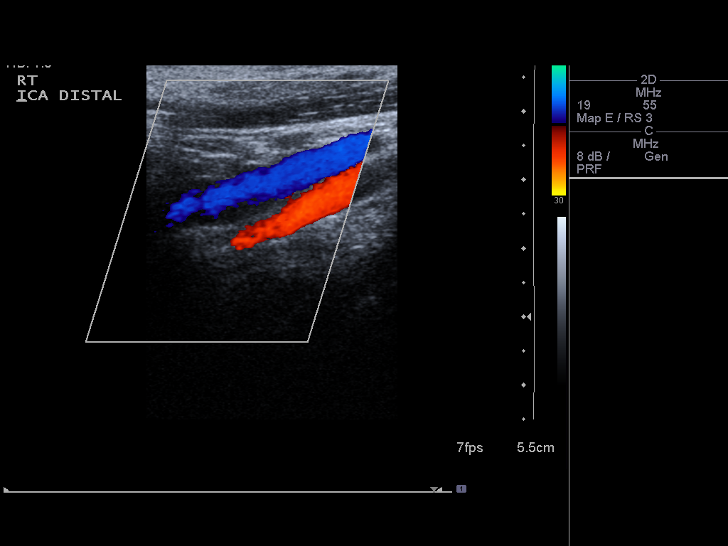
[im 34/65]
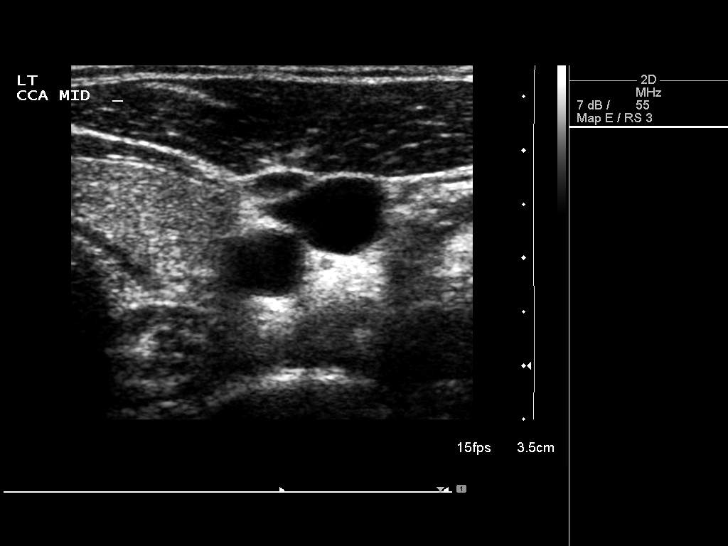
[im 37/65]
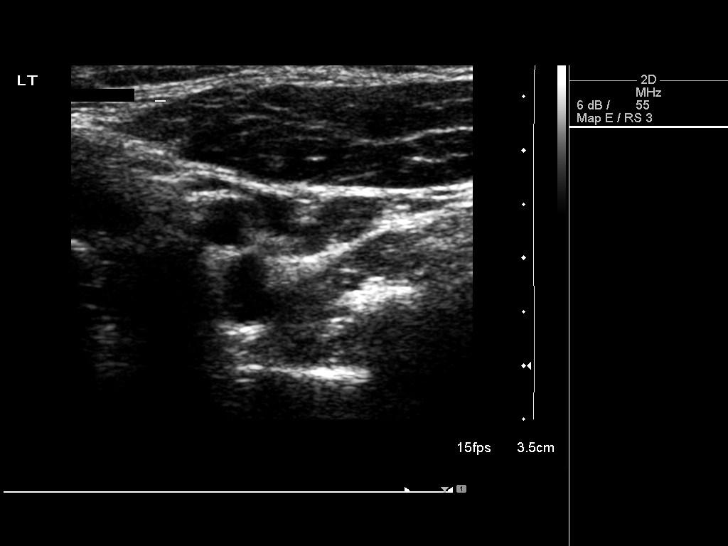
[im 42/65]
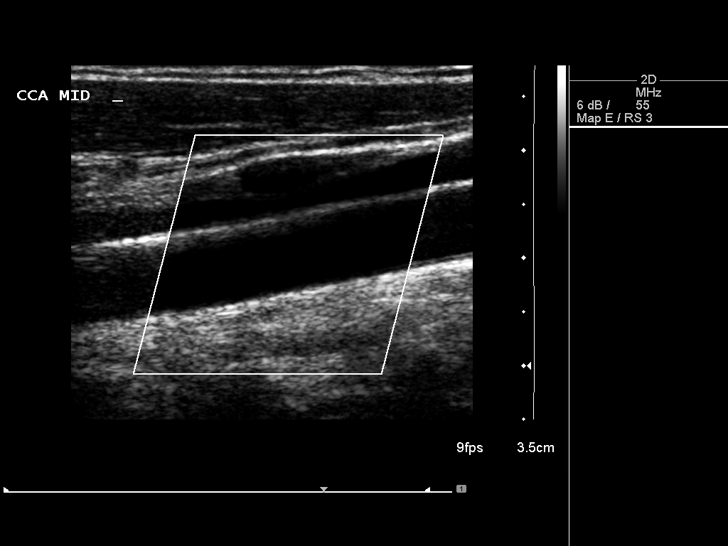
[im 48/65]
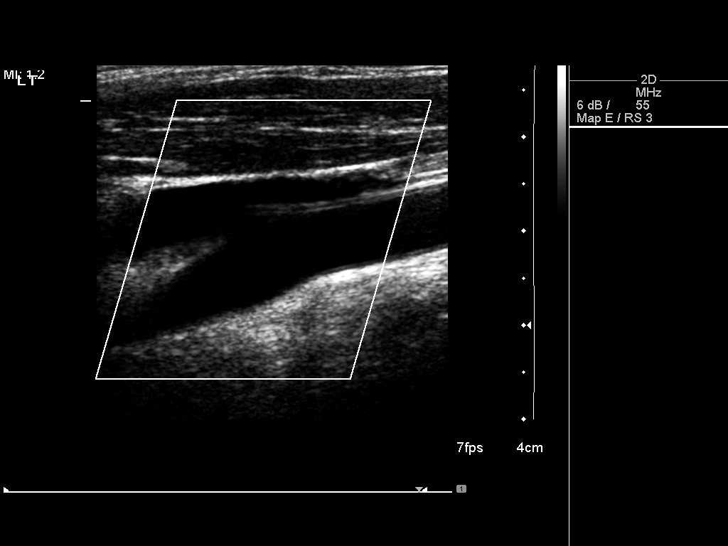
[im 53/65]
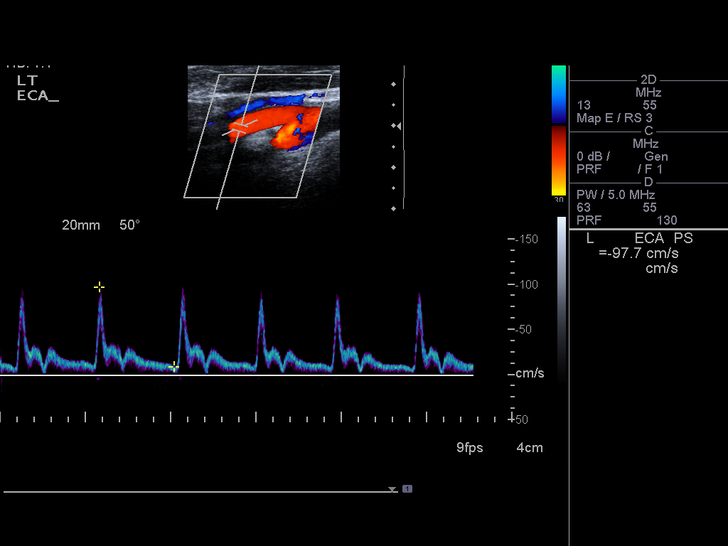
[im 59/65]
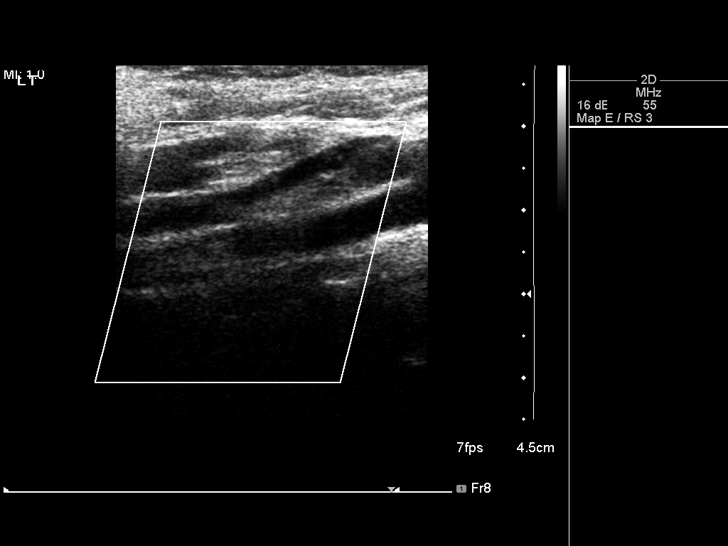
[im 65/65]
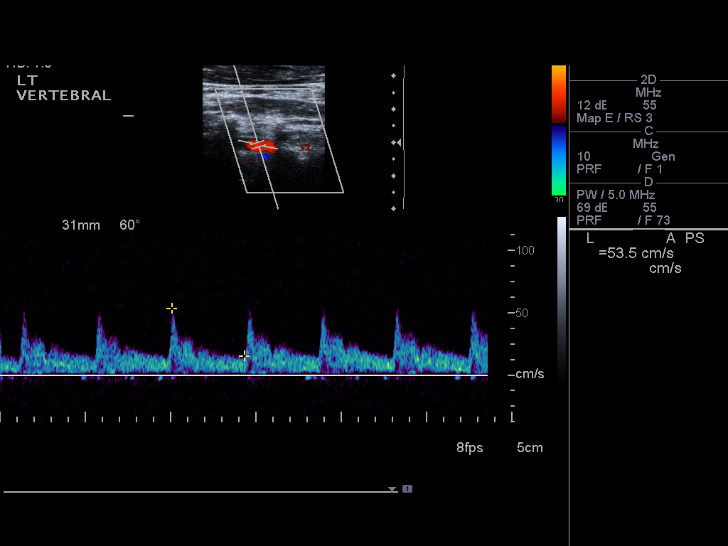

[13 of 24 positions shown; findings below may reference images not displayed]

FINDINGS: Criteria: Quantification of carotid stenosis is based on velocity
parameters that correlate the residual internal carotid diameter
with NASCET-based stenosis levels, using the diameter of the distal
internal carotid lumen as the denominator for stenosis measurement.

The following velocity measurements were obtained:

RIGHT

ICA:  83/30 cm/sec

CCA:  160/21 cm/sec

SYSTOLIC ICA/CCA RATIO:

DIASTOLIC ICA/CCA RATIO:

ECA:  117 cm/sec

LEFT

ICA:  107/21 cm/sec

CCA:  157/22 cm/sec

SYSTOLIC ICA/CCA RATIO:

DIASTOLIC ICA/CCA RATIO:

ECA:  98 cm/sec

RIGHT CAROTID ARTERY: There is no grayscale evidence of significant
intimal thickening or atherosclerotic plaque affecting the
interrogated portions of the right carotid system. There are no
elevated peak systolic velocities within the interrogated course of
the right internal carotid artery to suggest a hemodynamically
significant stenosis.

RIGHT VERTEBRAL ARTERY:  Antegrade flow

LEFT CAROTID ARTERY: There is no grayscale evidence of significant
intimal thickening or atherosclerotic plaque affecting interrogated
portions of the left carotid system. There are no elevated peak
systolic velocities within the interrogated course of the left
internal carotid artery to suggest a hemodynamically significant
stenosis.

LEFT VERTEBRAL ARTERY:  Antegrade flow
IMPRESSION: Normal carotid Doppler ultrasound.

## 2019-04-23 ENCOUNTER — Ambulatory Visit: Payer: BLUE CROSS/BLUE SHIELD | Admitting: Family Medicine

## 2019-07-24 ENCOUNTER — Encounter: Payer: BLUE CROSS/BLUE SHIELD | Admitting: Family Medicine

## 2019-09-28 ENCOUNTER — Other Ambulatory Visit: Payer: Self-pay

## 2019-09-28 ENCOUNTER — Encounter: Payer: Self-pay | Admitting: Family Medicine

## 2019-09-28 ENCOUNTER — Ambulatory Visit (INDEPENDENT_AMBULATORY_CARE_PROVIDER_SITE_OTHER): Payer: Medicaid Other | Admitting: Family Medicine

## 2019-09-28 VITALS — BP 134/68 | HR 88 | Temp 98.7°F | Resp 14 | Ht 70.0 in | Wt 234.0 lb

## 2019-09-28 DIAGNOSIS — R569 Unspecified convulsions: Secondary | ICD-10-CM | POA: Diagnosis not present

## 2019-09-28 DIAGNOSIS — R202 Paresthesia of skin: Secondary | ICD-10-CM

## 2019-09-28 DIAGNOSIS — Z0001 Encounter for general adult medical examination with abnormal findings: Secondary | ICD-10-CM

## 2019-09-28 DIAGNOSIS — I8391 Asymptomatic varicose veins of right lower extremity: Secondary | ICD-10-CM

## 2019-09-28 DIAGNOSIS — Z0000 Encounter for general adult medical examination without abnormal findings: Secondary | ICD-10-CM | POA: Diagnosis not present

## 2019-09-28 NOTE — Progress Notes (Signed)
   Subjective:    Patient ID: Danny Mathis, male    DOB: 1985/10/02, 34 y.o.   MRN: [id number]  Patient presents for Annual Exam (is fasting)    Pt here for CPE    He had bad teeth removed by dentist    "Episodes for the past 15 years " see previous OV, has seen cardiology in the past, has had imaging of brain which was negative.  The episodes have decreased since he quit drinking      Yesterday he had an "attack or episode " the last one before this 1 year ago. He was feeling good, he noticed a floater in his right eye, then right arm numbness , headache       He has some tingling in legs and feet at night that has been going on for a few months    No current meds   Due for fasting labs    He has also noticed a bulge on the back of his left leg, it swells up more at times      Review Of Systems:  GEN- denies fatigue, fever, weight loss,weakness, recent illness HEENT- denies eye drainage, change in vision, nasal discharge, CVS- denies chest pain, palpitations RESP- denies SOB, cough, wheeze ABD- denies N/V, change in stools, abd pain GU- denies dysuria, hematuria, dribbling, incontinence MSK- denies joint pain, muscle aches, injury Neuro- denies headache, dizziness, syncope, seizure activity       Objective:    BP 134/68   Pulse 88   Temp 98.7 F (37.1 C) (Temporal)   Resp 14   Ht 5\' 10"  (1.778 m)   Wt 234 lb (106.1 kg)   SpO2 97%   BMI 33.58 kg/m  GEN- NAD, alert and oriented x3 HEENT- PERRL, EOMI, non injected sclera, pink conjunctiva, MMM, oropharynx clear Neck- Supple, no thyromegaly CVS- RRR, no murmur RESP-CTAB ABD-NABS,soft,NT,ND Psych- normal affect and mood  EXT- No edema , RIght leg varioxce veins , large one right popliteal region and down the calf  Pulses- Radial, DP- 2+    PHQ-9 Score 4     Assessment & Plan:      Problem List Items Addressed This Visit    None    Visit Diagnoses    Routine general medical examination at a health  care facility    -  Primary   CPE done, fasting labs obtained, discussed COVID vaccine   Relevant Orders   CBC with Differential/Platelet (Completed)   Comprehensive metabolic panel (Completed)   Lipid panel (Completed)   TSH (Completed)   Varicose veins of right lower extremity, unspecified whether complicated       prominent varicose veins on legs, but he denies any pain, the paresthesia are bilat in feet, so likley a different issue, discussed vein doctor in the future , start compression socks    Paresthesia of both feet       this is different from his attacks, check metabolic panel, B12   Relevant Orders   Magnesium (Completed)   Vitamin B12 (Completed)   Seizure-like activity (HCC)       He did not have EEG performed when he was having so many attacks, anxiety was always on the table but so was TIA, refer to neurology, cardiology cleared him years ago       Note: This dictation was prepared with Dragon dictation along with smaller phrase technology. Any transcriptional errors that result from this process are unintentional.

## 2019-09-28 NOTE — Patient Instructions (Signed)
Referral to neurology F/U 1 year

## 2019-09-29 LAB — CBC WITH DIFFERENTIAL/PLATELET
Absolute Monocytes: 535 cells/uL (ref 200–950)
Basophils Absolute: 32 cells/uL (ref 0–200)
Basophils Relative: 0.6 %
Eosinophils Absolute: 140 cells/uL (ref 15–500)
Eosinophils Relative: 2.6 %
HCT: 43.8 % (ref 38.5–50.0)
Hemoglobin: 14.5 g/dL (ref 13.2–17.1)
Lymphs Abs: 2398 cells/uL (ref 850–3900)
MCH: 29.4 pg (ref 27.0–33.0)
MCHC: 33.1 g/dL (ref 32.0–36.0)
MCV: 88.8 fL (ref 80.0–100.0)
MPV: 11.2 fL (ref 7.5–12.5)
Monocytes Relative: 9.9 %
Neutro Abs: 2295 cells/uL (ref 1500–7800)
Neutrophils Relative %: 42.5 %
Platelets: 236 10*3/uL (ref 140–400)
RBC: 4.93 10*6/uL (ref 4.20–5.80)
RDW: 12.8 % (ref 11.0–15.0)
Total Lymphocyte: 44.4 %
WBC: 5.4 10*3/uL (ref 3.8–10.8)

## 2019-09-29 LAB — LIPID PANEL
Cholesterol: 211 mg/dL — ABNORMAL HIGH (ref <200)
HDL: 47 mg/dL (ref >=40)
LDL Cholesterol (Calc): 141 mg/dL (calc) — ABNORMAL HIGH
Non-HDL Cholesterol (Calc): 164 mg/dL (calc) — ABNORMAL HIGH (ref <130)
Total CHOL/HDL Ratio: 4.5 (calc) (ref <5.0)
Triglycerides: 115 mg/dL (ref <150)

## 2019-09-29 LAB — COMPREHENSIVE METABOLIC PANEL
AG Ratio: 2 (calc) (ref 1.0–2.5)
ALT: 25 U/L (ref 9–46)
AST: 20 U/L (ref 10–40)
Albumin: 4.3 g/dL (ref 3.6–5.1)
Alkaline phosphatase (APISO): 70 U/L (ref 36–130)
BUN: 13 mg/dL (ref 7–25)
CO2: 24 mmol/L (ref 20–32)
Calcium: 9.3 mg/dL (ref 8.6–10.3)
Chloride: 109 mmol/L (ref 98–110)
Creat: 0.85 mg/dL (ref 0.60–1.35)
Globulin: 2.2 g/dL (calc) (ref 1.9–3.7)
Glucose, Bld: 96 mg/dL (ref 65–99)
Potassium: 4.8 mmol/L (ref 3.5–5.3)
Sodium: 141 mmol/L (ref 135–146)
Total Bilirubin: 0.6 mg/dL (ref 0.2–1.2)
Total Protein: 6.5 g/dL (ref 6.1–8.1)

## 2019-09-29 LAB — MAGNESIUM: Magnesium: 2.1 mg/dL (ref 1.5–2.5)

## 2019-09-29 LAB — VITAMIN B12: Vitamin B-12: 327 pg/mL (ref 200–1100)

## 2019-09-29 LAB — TSH: TSH: 2.18 mIU/L (ref 0.40–4.50)

## 2019-09-30 ENCOUNTER — Encounter: Payer: Self-pay | Admitting: Family Medicine

## 2019-11-08 ENCOUNTER — Encounter: Payer: Self-pay | Admitting: *Deleted

## 2019-11-09 ENCOUNTER — Ambulatory Visit: Payer: Medicaid Other | Admitting: Neurology

## 2020-01-14 ENCOUNTER — Ambulatory Visit: Payer: Medicaid Other | Admitting: Neurology

## 2020-01-14 ENCOUNTER — Encounter: Payer: Self-pay | Admitting: Neurology

## 2020-01-14 ENCOUNTER — Telehealth: Payer: Self-pay | Admitting: Neurology

## 2020-01-14 NOTE — Telephone Encounter (Signed)
Pt called and LVM stating that he is not able to make his appt today. This is a new pt and it will be the pt's second same day cancellation/No Show. Dismiss ? Please advise.

## 2020-01-14 NOTE — Telephone Encounter (Signed)
This is the second new patient no-show, the patient will be discharged from our practice. 

## 2020-01-14 NOTE — Telephone Encounter (Signed)
York Spaniel, MD to Loralie Champagne D   CW  01/14/20 8:45 AM Please discharge this patient from our practice.  York Spaniel, MD   CW  01/14/20 8:45 AM Note This is the second new patient no-show, the patient will be discharged from our practice.

## 2020-01-15 ENCOUNTER — Encounter: Payer: Self-pay | Admitting: Neurology

## 2020-09-24 ENCOUNTER — Ambulatory Visit: Payer: Medicaid Other | Admitting: Nurse Practitioner

## 2020-09-24 ENCOUNTER — Other Ambulatory Visit: Payer: Self-pay

## 2020-09-24 ENCOUNTER — Encounter: Payer: Self-pay | Admitting: Nurse Practitioner

## 2020-09-24 VITALS — BP 150/78 | HR 68 | Temp 98.4°F | Resp 20 | Ht 71.5 in | Wt 249.0 lb

## 2020-09-24 DIAGNOSIS — I10 Essential (primary) hypertension: Secondary | ICD-10-CM | POA: Diagnosis not present

## 2020-09-24 DIAGNOSIS — Z139 Encounter for screening, unspecified: Secondary | ICD-10-CM | POA: Diagnosis not present

## 2020-09-24 DIAGNOSIS — Z7689 Persons encountering health services in other specified circumstances: Secondary | ICD-10-CM | POA: Diagnosis not present

## 2020-09-24 DIAGNOSIS — F410 Panic disorder [episodic paroxysmal anxiety] without agoraphobia: Secondary | ICD-10-CM

## 2020-09-24 DIAGNOSIS — B079 Viral wart, unspecified: Secondary | ICD-10-CM

## 2020-09-24 NOTE — Assessment & Plan Note (Signed)
-  Dr. Deirdre Peer records are available in Epic; briefly reviewed

## 2020-09-24 NOTE — Patient Instructions (Signed)
Please have fasting labs drawn 2-3 days prior to your appointment so we can discuss the results during your office visit.  

## 2020-09-24 NOTE — Assessment & Plan Note (Signed)
BP Readings from Last 3 Encounters:  09/24/20 (!) 150/78  09/28/19 134/68  12/10/17 136/88  -BP elevated today, but he doesn't take meds routinely -will recheck in 2 weeks

## 2020-09-24 NOTE — Assessment & Plan Note (Signed)
-  not sure if these are panic attacks vs migraine -no acute issues, but may benefit from migraine prevention meds in the future -states he has had neuro and cardiology consult in the past

## 2020-09-24 NOTE — Progress Notes (Signed)
New Patient Office Visit  Subjective:  Patient ID: Danny Mathis, male    DOB: Sep 22, 1985  Age: 35 y.o. MRN: [id number]  CC:  Chief Complaint  Patient presents with  . New Patient (Initial Visit)    HPI Danny Mathis presents for new patient visit. Transferring care from Dr. Buelah Manis. Last physical was on 09/28/19, and his last set of labs were drawn at that time too.  He has lesion on his right index finger that is painful. It has been present for 3 weeks. He states it popped up over night and didn't go away. He tried to pop it, but it did not drain.  He has episodes with funny taste, tingling in finger tips that radiates up his arms, blurred vision... he has had neuro and cardiology workup, and he has bad headaches afterwards. He has hx of migraine and concussions.  He took propranolol as a teenager, but has not had any medicines for migraine since.  He states these are triggered by energy drinks and alcohol.  He states these occur at random, but last episode was 2 months ago.   Past Medical History:  Diagnosis Date  . ADHD (attention deficit hyperactivity disorder)   . Anxiety   . Depression   . Hypertension   . MDD (major depressive disorder)   . Paresthesia of both feet   . Stroke [facility]) 2013  . TIA (transient ischemic attack)     Past Surgical History:  Procedure Laterality Date  . HERNIA REPAIR      Family History  Problem Relation Age of Onset  . Arthritis Mother   . COPD Mother   . Depression Mother   . Hearing loss Mother   . Hyperlipidemia Mother   . Hypertension Mother   . Alcohol abuse Father   . Arthritis Father   . Cancer Father   . Hearing loss Father   . Heart disease Father   . Hyperlipidemia Father   . Hypertension Father   . Diabetes Maternal Grandmother   . Hypertension Maternal Grandmother   . Hypertension Maternal Grandfather   . Diabetes Paternal Grandmother   . Heart disease Paternal Grandmother   . Hypertension Paternal Grandmother    . Miscarriages / Stillbirths Paternal Grandmother   . Stroke Paternal Grandmother   . Vision loss Paternal Grandmother   . Heart disease Paternal Grandfather   . Hypertension Paternal Grandfather   . Stroke Paternal Grandfather   . Vision loss Paternal Grandfather   . Arthritis Sister   . Hearing loss Sister   . Hyperlipidemia Sister   . Hypertension Sister     Social History   Socioeconomic History  . Marital status: Married    Spouse name: Not on file  . Number of children: Not on file  . Years of education: Not on file  . Highest education level: Not on file  Occupational History  . 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 Smoking status: Current Every Day Smoker    Packs/day: 1.00    Years: 24.00    Pack years: 24.00    Types: Cigarettes  . Smokeless tobacco: Never Used  Vaping Use  . Vaping Use: Never used  Substance and Sexual Activity  . Alcohol use: Not Currently  . Drug use: Yes    Types: Marijuana  . Sexual activity: Yes  Other Topics Concern  . Not on file  Social History Narrative  .  Not on file   Social Determinants of Health   Financial Resource Strain: Not on file  Food Insecurity: Not on file  Transportation Needs: Not on file  Physical Activity: Not on file  Stress: Not on file  Social Connections: Not on file  Intimate Partner Violence: Not on file    ROS Review of Systems  Constitutional: Negative.   Respiratory: Negative.   Cardiovascular: Negative.   Skin:       Right index finger has lesion  Psychiatric/Behavioral: Negative.     Objective:   Today's Vitals: BP (!) 150/78   Pulse 68   Temp 98.4 F (36.9 C)   Resp 20   Ht 5' 11.5" (1.816 m)   Wt 249 lb (112.9 kg)   SpO2 97%   BMI 34.24 kg/m   Physical Exam Constitutional:      Appearance: Normal appearance.  Cardiovascular:     Rate and Rhythm: Normal rate and regular rhythm.     Pulses: Normal pulses.      Heart sounds: Normal heart sounds.  Pulmonary:     Effort: Pulmonary effort is normal.     Breath sounds: Normal breath sounds.  Skin:    Findings: Lesion present.     Comments: Right index finger has hyperkeratotic lesion (looks like a wart)  Neurological:     Mental Status: He is alert.  Psychiatric:        Mood and Affect: Mood normal.        Behavior: Behavior normal.        Thought Content: Thought content normal.        Judgment: Judgment normal.     Assessment & Plan:   Problem List Items Addressed This Visit      Cardiovascular and Mediastinum   Hypertension    BP Readings from Last 3 Encounters:  09/24/20 (!) 150/78  09/28/19 134/68  12/10/17 136/88  -BP elevated today, but he doesn't take meds routinely -will recheck in 2 weeks       Relevant Orders   CBC with Differential/Platelet   CMP14+EGFR   Lipid Panel With LDL/HDL Ratio     Other   Panic attacks    -not sure if these are panic attacks vs migraine -no acute issues, but may benefit from migraine prevention meds in the future -states he has had neuro and cardiology consult in the past      Encounter to establish care    -Dr. Dorian Heckle records are available in Epic; briefly reviewed      Relevant Orders   CBC with Differential/Platelet   CMP14+EGFR   HCV Ab w/Rflx to Verification   Lipid Panel With LDL/HDL Ratio   Screening due    -will screen for HCV with next set of labs      Relevant Orders   HCV Ab w/Rflx to Verification    Other Visit Diagnoses    Viral warts, unspecified type    -  Primary   Relevant Orders   Ambulatory referral to Dermatology      Outpatient Encounter Medications as of 09/24/2020  Medication Sig  . [DISCONTINUED] acetaminophen (TYLENOL) 500 MG tablet Take 1,000 mg by mouth every 6 (six) hours as needed for mild pain. (Patient not taking: Reported on 09/24/2020)  . [DISCONTINUED] ibuprofen (ADVIL,MOTRIN) 200 MG tablet Take 400 mg by mouth every 6 (six) hours as  needed for moderate pain.   No facility-administered encounter medications on file as of 09/24/2020.    Follow-up: Return  in about 2 weeks (around 10/08/2020) for Physical Exam.   Danny Larsson, NP

## 2020-09-24 NOTE — Assessment & Plan Note (Signed)
-  will screen for HCV with next set of labs 

## 2020-10-13 ENCOUNTER — Encounter: Payer: Medicaid Other | Admitting: Nurse Practitioner

## 2020-10-23 ENCOUNTER — Encounter: Payer: Medicaid Other | Admitting: Nurse Practitioner

## 2020-11-05 ENCOUNTER — Encounter: Payer: Medicaid Other | Admitting: Nurse Practitioner

## 2020-11-17 ENCOUNTER — Encounter: Payer: Medicaid Other | Admitting: Nurse Practitioner

## 2023-11-20 ENCOUNTER — Encounter (HOSPITAL_COMMUNITY): Payer: Self-pay

## 2023-11-20 ENCOUNTER — Emergency Department (HOSPITAL_COMMUNITY)
Admission: EM | Admit: 2023-11-20 | Discharge: 2023-11-20 | Disposition: A | Discharge status: Home / Self Care | Attending: Emergency Medicine | Admitting: Emergency Medicine

## 2023-11-20 ENCOUNTER — Other Ambulatory Visit: Payer: Self-pay

## 2023-11-20 DIAGNOSIS — I8001 Phlebitis and thrombophlebitis of superficial vessels of right lower extremity: Secondary | ICD-10-CM | POA: Diagnosis not present

## 2023-11-20 DIAGNOSIS — I10 Essential (primary) hypertension: Secondary | ICD-10-CM | POA: Diagnosis not present

## 2023-11-20 DIAGNOSIS — F17210 Nicotine dependence, cigarettes, uncomplicated: Secondary | ICD-10-CM | POA: Diagnosis not present

## 2023-11-20 DIAGNOSIS — M79661 Pain in right lower leg: Secondary | ICD-10-CM | POA: Diagnosis present

## 2023-11-20 NOTE — ED Notes (Signed)
 Pt/family received d/c paperwork at this time. After going over the paperwork any questions, comments, or concerns were answered to the best of this nurse's knowledge. The pt/family verbally acknowledged the teachings/instructions.

## 2023-11-20 NOTE — Discharge Instructions (Signed)
 You were evaluated in the Emergency Department and after careful evaluation, we did not find any emergent condition requiring admission or further testing in the hospital.  Your exam/testing today was overall reassuring.  We suspect that your symptoms are due to a superficial blood clot in your leg.  This can normally be treated with warm compresses and Tylenol  or Motrin  for discomfort.  We do recommend that you return for a more formal ultrasound to make sure there are no deeper blood clots.  Please return to the Emergency Department if you experience any worsening of your condition.  Thank you for allowing us  to be a part of your care.

## 2023-11-20 NOTE — ED Provider Notes (Signed)
 [facility]-EMERGENCY DEPT [facility] Emergency Department Provider Note MRN:  [id number]  Arrival date & time: 11/20/23     Chief Complaint   Leg Pain   History of Present Illness   Danny Mathis is a 38 y.o. year-old male with no pertinent past medical history presenting to the ED with chief complaint of leg pain.  Woke up with pain to the lateral aspect of the right lower leg 2 or 3 days ago, not going away.  Denies fever, no chest pain or shortness of breath, no other symptoms, denies trauma, no recent pimples to the area.  Review of Systems  A thorough review of systems was obtained and all systems are negative except as noted in the HPI and PMH.   Patient's Health History    Past Medical History:  Diagnosis Date   ADHD (attention deficit hyperactivity disorder)    Anxiety    Depression    Hypertension    MDD (major depressive disorder)    Paresthesia of both feet    Stroke (HCC) 2013   TIA (transient ischemic attack)     Past Surgical History:  Procedure Laterality Date   HERNIA REPAIR      Family History  Problem Relation Age of Onset   Arthritis Mother    COPD Mother    Depression Mother    Hearing loss Mother    Hyperlipidemia Mother    Hypertension Mother    Alcohol abuse Father    Arthritis Father    Cancer Father    Hearing loss Father    Heart disease Father    Hyperlipidemia Father    Hypertension Father    Diabetes Maternal Grandmother    Hypertension Maternal Grandmother    Hypertension Maternal Grandfather    Diabetes Paternal Grandmother    Heart disease Paternal Grandmother    Hypertension Paternal Grandmother    Miscarriages / Stillbirths Paternal Grandmother    Stroke Paternal Grandmother    Vision loss Paternal Grandmother    Heart disease Paternal Grandfather    Hypertension Paternal Grandfather    Stroke Paternal Grandfather    Vision loss Paternal Grandfather    Arthritis Sister    Hearing loss Sister    Hyperlipidemia Sister     Hypertension Sister     Social History   Socioeconomic History   Marital status: Married    Spouse name: Not on file   Number of children: Not on file   Years of education: Not on file   Highest education level: Not on file  Occupational History   Occupation: Self-Employed    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  Tobacco Use   Smoking status: Every Day    Current packs/day: 1.00    Average packs/day: 1 pack/day for 24.0 years (24.0 ttl pk-yrs)    Types: Cigarettes   Smokeless tobacco: Never  Vaping Use   Vaping status: Never Used  Substance and Sexual Activity   Alcohol use: Not Currently   Drug use: Yes    Types: Marijuana   Sexual activity: Yes  Other Topics Concern   Not on file  Social History Narrative   Not on file   Social Drivers of Health   Financial Resource Strain: Low Risk  (06/13/2023)   Received from [facility]   Overall Financial Resource Strain (CARDIA)    Difficulty of Paying Living Expenses: Not hard at all  Food Insecurity: No Food Insecurity (06/13/2023)   Received from [facility]   Hunger  Vital Sign    Within the past 12 months, you worried that your food would run out before you got the money to buy more.: Never true    Within the past 12 months, the food you bought just didn't last and you didn't have money to get more.: Never true  Transportation Needs: No Transportation Needs (06/13/2023)   Received from [facility]   PRAPARE - Transportation    Lack of Transportation (Medical): No    Lack of Transportation (Non-Medical): No  Physical Activity: Unknown (08/22/2022)   Received from [facility]   Exercise Vital Sign    On average, how many days per week do you engage in moderate to strenuous exercise (like a brisk walk)?: 7 days    Minutes of Exercise per Session: Not on file  Stress: No Stress Concern Present (08/22/2022)   Received from [facility] - Occupational Stress  Questionnaire    Feeling of Stress : Only a little  Social Connections: Socially Integrated (08/22/2022)   Received from [facility]   Social Network    How would you rate your social network (family, work, friends)?: Good participation with social networks  Intimate Partner Violence: Not At Risk (08/22/2022)   Received from [facility]   HITS    Over the last 12 months how often did your partner physically hurt you?: Never    Over the last 12 months how often did your partner insult you or talk down to you?: Never    Over the last 12 months how often did your partner threaten you with physical harm?: Never    Over the last 12 months how often did your partner scream or curse at you?: Never     Physical Exam   Vitals:   11/20/23 2310  BP: (!) 161/95  Pulse: 62  Resp: 17  Temp: 98.1 F (36.7 C)  SpO2: 97%    CONSTITUTIONAL: Well-appearing, NAD NEURO/PSYCH:  Alert and oriented x 3, no focal deficits EYES:  eyes equal and reactive ENT/NECK:  no LAD, no JVD CARDIO: Regular rate, well-perfused, normal S1 and S2 PULM:  CTAB no wheezing or rhonchi GI/GU:  non-distended, non-tender MSK/SPINE:  No gross deformities, no edema SKIN:  no rash, atraumatic   *Additional and/or pertinent findings included in MDM below  Diagnostic and Interventional Summary    EKG Interpretation Date/Time:    Ventricular Rate:    PR Interval:    QRS Duration:    QT Interval:    QTC Calculation:   R Axis:      Text Interpretation:         Labs Reviewed - No data to display  US  Venous Img Lower Unilateral Right    (Results Pending)    Medications - No data to display   Procedures  /  Critical Care .Ultrasound ED Soft Tissue  Date/Time: 11/20/2023 11:40 PM  Performed by: Theadore Ozell HERO, MD Authorized by: Theadore Ozell HERO, MD   Procedure details:    Indications: limb pain     Transverse view:  Visualized   Longitudinal view:  Visualized   Images: archived   Location:    Location:  lower extremity     Side:  Right Comments:     Noncompressible venous structures in the area of discomfort, superficial.   ED Course and Medical Decision Making  Initial Impression and Ddx Patient has bruising to the area of the fibular head on the right leg  and tracking more distally near the mid calf laterally there is an oval shaped area of erythema and tenderness.  Question superficial thrombophlebitis versus cellulitis versus abscess.  Past medical/surgical history that increases complexity of ED encounter: None  Interpretation of Diagnostics Laboratory and/or imaging options to aid in the diagnosis/care of the patient were considered.  After careful history and physical examination, it was determined that there was no indication for diagnostics at this time, with the exception of bedside ultrasound.  Patient Reassessment and Ultimate Disposition/Management     Based on the ultrasound, doubt infectious cause.  Seems consistent with superficial thrombophlebitis.  To be thorough and to exclude DVT will have patient return for formal ultrasound.  Appropriate for discharge.  Patient management required discussion with the following services or consulting groups:  None  Complexity of Problems Addressed Acute illness or injury that poses threat of life of bodily function  Additional Data Reviewed and Analyzed Further history obtained from: Further history from spouse/family member  Additional Factors Impacting ED Encounter Risk Minor Procedures  Ozell HERO. Theadore, MD [facility] Emergency Medicine [facility] [email or web address]  Final Clinical Impressions(s) / ED Diagnoses     ICD-10-CM   1. Thrombophlebitis of superficial veins of right lower extremity  I80.01       ED Discharge Orders          Ordered    US  Venous Img Lower Unilateral Right        11/20/23 2339             Discharge Instructions Discussed with and Provided to Patient:     Discharge Instructions      You were evaluated in the Emergency Department and after careful evaluation, we did not find any emergent condition requiring admission or further testing in the hospital.  Your exam/testing today was overall reassuring.  We suspect that your symptoms are due to a superficial blood clot in your leg.  This can normally be treated with warm compresses and Tylenol  or Motrin  for discomfort.  We do recommend that you return for a more formal ultrasound to make sure there are no deeper blood clots.  Please return to the Emergency Department if you experience any worsening of your condition.  Thank you for allowing us  to be a part of your care.       Theadore Ozell HERO, MD 11/20/23 [phone]

## 2023-11-20 NOTE — ED Triage Notes (Signed)
 Patient from home for R leg swelling that started yesterday. Patient reports it is swollen, red, and warm to touch. Reports relief with rest and elevation; worsens when walking and has a burning sensation. No injuries or bites that he is aware of. History of varicose veins, concerned about DVT. Upon arrival to ER, patient is alert and oriented, ambu. Pain 3/10

## 2023-11-21 ENCOUNTER — Ambulatory Visit (HOSPITAL_COMMUNITY)
Admission: RE | Admit: 2023-11-21 | Discharge: 2023-11-21 | Disposition: A | Discharge status: Home / Self Care | Source: Ambulatory Visit | Attending: Emergency Medicine | Admitting: Emergency Medicine

## 2023-11-21 DIAGNOSIS — I8001 Phlebitis and thrombophlebitis of superficial vessels of right lower extremity: Secondary | ICD-10-CM | POA: Diagnosis present
